# Patient Record
Sex: Male | Born: 1945 | Race: White | Hispanic: No | Marital: Married | Smoking: Never smoker
Health system: Southern US, Community
[De-identification: ages and names within clinical notes are randomized; demographics above are authoritative.]

## PROBLEM LIST (undated history)

## (undated) DIAGNOSIS — K635 Polyp of colon: Secondary | ICD-10-CM

## (undated) DIAGNOSIS — E785 Hyperlipidemia, unspecified: Secondary | ICD-10-CM

## (undated) DIAGNOSIS — K648 Other hemorrhoids: Secondary | ICD-10-CM

## (undated) DIAGNOSIS — I2510 Atherosclerotic heart disease of native coronary artery without angina pectoris: Secondary | ICD-10-CM

## (undated) DIAGNOSIS — K5730 Diverticulosis of large intestine without perforation or abscess without bleeding: Secondary | ICD-10-CM

## (undated) DIAGNOSIS — E119 Type 2 diabetes mellitus without complications: Secondary | ICD-10-CM

## (undated) DIAGNOSIS — Z8546 Personal history of malignant neoplasm of prostate: Secondary | ICD-10-CM

## (undated) DIAGNOSIS — I10 Essential (primary) hypertension: Secondary | ICD-10-CM

## (undated) DIAGNOSIS — K521 Toxic gastroenteritis and colitis: Secondary | ICD-10-CM

## (undated) HISTORY — DX: Atherosclerotic heart disease of native coronary artery without angina pectoris: I25.10

## (undated) HISTORY — DX: Essential (primary) hypertension: I10

## (undated) HISTORY — DX: Diverticulosis of large intestine without perforation or abscess without bleeding: K57.30

## (undated) HISTORY — DX: Toxic gastroenteritis and colitis: K52.1

## (undated) HISTORY — DX: Polyp of colon: K63.5

## (undated) HISTORY — DX: Hyperlipidemia, unspecified: E78.5

## (undated) HISTORY — DX: Type 2 diabetes mellitus without complications: E11.9

## (undated) HISTORY — DX: Personal history of malignant neoplasm of prostate: Z85.46

## (undated) HISTORY — DX: Other hemorrhoids: K64.8

---

## 1994-11-07 HISTORY — PX: OTHER SURGICAL HISTORY: SHX169

## 1998-11-07 HISTORY — PX: OTHER SURGICAL HISTORY: SHX169

## 2001-12-14 DIAGNOSIS — D126 Benign neoplasm of colon, unspecified: Secondary | ICD-10-CM | POA: Insufficient documentation

## 2004-04-27 ENCOUNTER — Encounter (INDEPENDENT_AMBULATORY_CARE_PROVIDER_SITE_OTHER): Payer: Self-pay | Admitting: Gastroenterology

## 2004-11-02 ENCOUNTER — Other Ambulatory Visit: Payer: Self-pay

## 2004-11-02 ENCOUNTER — Emergency Department: Payer: Self-pay | Admitting: Emergency Medicine

## 2004-12-02 ENCOUNTER — Ambulatory Visit: Payer: Self-pay | Admitting: Radiation Oncology

## 2004-12-08 ENCOUNTER — Ambulatory Visit: Payer: Self-pay | Admitting: Radiation Oncology

## 2005-06-02 ENCOUNTER — Ambulatory Visit: Payer: Self-pay | Admitting: Radiation Oncology

## 2005-12-08 ENCOUNTER — Ambulatory Visit: Payer: Self-pay | Admitting: Radiation Oncology

## 2006-03-03 ENCOUNTER — Ambulatory Visit: Payer: Self-pay | Admitting: Gastroenterology

## 2006-04-18 ENCOUNTER — Ambulatory Visit: Payer: Self-pay | Admitting: Gastroenterology

## 2006-04-18 DIAGNOSIS — K648 Other hemorrhoids: Secondary | ICD-10-CM | POA: Insufficient documentation

## 2006-04-18 DIAGNOSIS — K5730 Diverticulosis of large intestine without perforation or abscess without bleeding: Secondary | ICD-10-CM | POA: Insufficient documentation

## 2006-04-18 DIAGNOSIS — K521 Toxic gastroenteritis and colitis: Secondary | ICD-10-CM

## 2006-07-18 ENCOUNTER — Observation Stay: Payer: Self-pay | Admitting: Internal Medicine

## 2006-07-18 ENCOUNTER — Other Ambulatory Visit: Payer: Self-pay

## 2006-12-25 ENCOUNTER — Ambulatory Visit: Payer: Self-pay | Admitting: Internal Medicine

## 2007-06-08 ENCOUNTER — Ambulatory Visit: Payer: Self-pay | Admitting: Radiation Oncology

## 2007-06-22 ENCOUNTER — Ambulatory Visit: Payer: Self-pay | Admitting: Radiation Oncology

## 2007-07-09 ENCOUNTER — Ambulatory Visit: Payer: Self-pay | Admitting: Radiation Oncology

## 2007-12-16 ENCOUNTER — Other Ambulatory Visit: Payer: Self-pay

## 2007-12-16 ENCOUNTER — Emergency Department: Payer: Self-pay | Admitting: Unknown Physician Specialty

## 2008-06-20 ENCOUNTER — Ambulatory Visit: Payer: Self-pay | Admitting: Radiation Oncology

## 2008-07-08 ENCOUNTER — Ambulatory Visit: Payer: Self-pay | Admitting: Radiation Oncology

## 2008-07-29 DIAGNOSIS — Z8546 Personal history of malignant neoplasm of prostate: Secondary | ICD-10-CM

## 2008-07-30 ENCOUNTER — Ambulatory Visit: Payer: Self-pay | Admitting: Gastroenterology

## 2008-07-30 DIAGNOSIS — K520 Gastroenteritis and colitis due to radiation: Secondary | ICD-10-CM | POA: Insufficient documentation

## 2008-07-30 DIAGNOSIS — I2510 Atherosclerotic heart disease of native coronary artery without angina pectoris: Secondary | ICD-10-CM | POA: Insufficient documentation

## 2008-08-18 ENCOUNTER — Ambulatory Visit (HOSPITAL_COMMUNITY): Admission: RE | Admit: 2008-08-18 | Discharge: 2008-08-18 | Payer: Self-pay | Admitting: Gastroenterology

## 2008-08-18 ENCOUNTER — Ambulatory Visit: Payer: Self-pay | Admitting: Gastroenterology

## 2008-11-17 ENCOUNTER — Ambulatory Visit: Payer: Self-pay | Admitting: Gastroenterology

## 2008-12-04 ENCOUNTER — Ambulatory Visit: Payer: Self-pay | Admitting: Urology

## 2008-12-10 ENCOUNTER — Ambulatory Visit: Payer: Self-pay | Admitting: Urology

## 2009-03-07 ENCOUNTER — Ambulatory Visit: Payer: Self-pay | Admitting: Radiation Oncology

## 2009-04-03 ENCOUNTER — Ambulatory Visit: Payer: Self-pay | Admitting: Radiation Oncology

## 2009-04-07 ENCOUNTER — Ambulatory Visit: Payer: Self-pay | Admitting: Radiation Oncology

## 2009-05-07 ENCOUNTER — Ambulatory Visit: Payer: Self-pay | Admitting: Radiation Oncology

## 2009-06-07 ENCOUNTER — Ambulatory Visit: Payer: Self-pay | Admitting: Radiation Oncology

## 2009-07-03 ENCOUNTER — Ambulatory Visit: Payer: Self-pay | Admitting: Nephrology

## 2009-07-08 ENCOUNTER — Ambulatory Visit: Payer: Self-pay | Admitting: Oncology

## 2009-07-16 ENCOUNTER — Encounter: Payer: Self-pay | Admitting: Cardiovascular Disease

## 2009-07-21 ENCOUNTER — Encounter: Payer: Self-pay | Admitting: Cardiovascular Disease

## 2009-07-21 ENCOUNTER — Ambulatory Visit: Payer: Self-pay | Admitting: Urology

## 2009-07-22 ENCOUNTER — Encounter: Payer: Self-pay | Admitting: Cardiovascular Disease

## 2009-07-24 ENCOUNTER — Telehealth (INDEPENDENT_AMBULATORY_CARE_PROVIDER_SITE_OTHER): Payer: Self-pay | Admitting: *Deleted

## 2009-07-24 ENCOUNTER — Ambulatory Visit: Payer: Self-pay | Admitting: Cardiovascular Disease

## 2009-07-27 ENCOUNTER — Encounter: Payer: Self-pay | Admitting: Cardiology

## 2009-07-27 ENCOUNTER — Ambulatory Visit: Payer: Self-pay

## 2009-07-28 ENCOUNTER — Encounter: Payer: Self-pay | Admitting: Cardiovascular Disease

## 2009-07-29 ENCOUNTER — Ambulatory Visit: Payer: Self-pay | Admitting: Urology

## 2009-08-07 ENCOUNTER — Ambulatory Visit: Payer: Self-pay | Admitting: Oncology

## 2009-09-04 ENCOUNTER — Ambulatory Visit: Payer: Self-pay | Admitting: Urology

## 2009-09-07 ENCOUNTER — Ambulatory Visit: Payer: Self-pay | Admitting: Oncology

## 2009-10-07 ENCOUNTER — Ambulatory Visit: Payer: Self-pay | Admitting: Oncology

## 2009-10-16 ENCOUNTER — Ambulatory Visit: Payer: Self-pay | Admitting: Oncology

## 2009-11-07 ENCOUNTER — Ambulatory Visit: Payer: Self-pay | Admitting: Oncology

## 2009-11-30 ENCOUNTER — Ambulatory Visit: Payer: Self-pay | Admitting: Nephrology

## 2009-12-08 ENCOUNTER — Ambulatory Visit: Payer: Self-pay | Admitting: Oncology

## 2010-01-05 ENCOUNTER — Ambulatory Visit: Payer: Self-pay | Admitting: Oncology

## 2010-02-05 ENCOUNTER — Ambulatory Visit: Payer: Self-pay | Admitting: Oncology

## 2010-03-07 ENCOUNTER — Ambulatory Visit: Payer: Self-pay | Admitting: Oncology

## 2010-04-07 ENCOUNTER — Ambulatory Visit: Payer: Self-pay | Admitting: Oncology

## 2010-05-07 ENCOUNTER — Ambulatory Visit: Payer: Self-pay | Admitting: Oncology

## 2010-06-07 ENCOUNTER — Ambulatory Visit: Payer: Self-pay | Admitting: Oncology

## 2010-06-14 ENCOUNTER — Ambulatory Visit: Payer: Self-pay | Admitting: Gastroenterology

## 2010-06-16 LAB — PATHOLOGY REPORT

## 2010-06-18 ENCOUNTER — Ambulatory Visit: Payer: Self-pay | Admitting: Gastroenterology

## 2010-06-30 ENCOUNTER — Ambulatory Visit: Payer: Self-pay | Admitting: Urology

## 2010-07-07 ENCOUNTER — Ambulatory Visit: Payer: Self-pay | Admitting: Oncology

## 2010-07-14 ENCOUNTER — Ambulatory Visit: Payer: Self-pay | Admitting: Urology

## 2010-07-16 ENCOUNTER — Ambulatory Visit: Payer: Self-pay | Admitting: Oncology

## 2010-08-07 ENCOUNTER — Ambulatory Visit: Payer: Self-pay | Admitting: Oncology

## 2010-09-07 ENCOUNTER — Ambulatory Visit: Payer: Self-pay | Admitting: Oncology

## 2010-09-20 ENCOUNTER — Ambulatory Visit: Payer: Self-pay | Admitting: Oncology

## 2010-09-21 LAB — PSA

## 2010-10-02 ENCOUNTER — Inpatient Hospital Stay: Payer: Self-pay | Admitting: Specialist

## 2010-10-07 ENCOUNTER — Ambulatory Visit: Payer: Self-pay | Admitting: Oncology

## 2010-10-26 LAB — PSA

## 2010-11-07 ENCOUNTER — Ambulatory Visit: Payer: Self-pay | Admitting: Oncology

## 2010-11-30 ENCOUNTER — Ambulatory Visit: Payer: Self-pay | Admitting: Surgery

## 2010-12-01 ENCOUNTER — Ambulatory Visit: Payer: Self-pay | Admitting: Internal Medicine

## 2010-12-02 ENCOUNTER — Ambulatory Visit: Payer: Self-pay | Admitting: Surgery

## 2010-12-08 ENCOUNTER — Ambulatory Visit: Payer: Self-pay | Admitting: Oncology

## 2011-01-06 ENCOUNTER — Ambulatory Visit: Payer: Self-pay | Admitting: Oncology

## 2011-02-06 ENCOUNTER — Ambulatory Visit: Payer: Self-pay | Admitting: Oncology

## 2011-03-05 LAB — PSA

## 2011-03-08 ENCOUNTER — Ambulatory Visit: Payer: Self-pay | Admitting: Oncology

## 2011-04-08 ENCOUNTER — Ambulatory Visit: Payer: Self-pay | Admitting: Oncology

## 2011-05-08 ENCOUNTER — Ambulatory Visit: Payer: Self-pay | Admitting: Oncology

## 2011-05-23 ENCOUNTER — Encounter: Payer: Self-pay | Admitting: Cardiovascular Disease

## 2011-05-24 ENCOUNTER — Encounter: Payer: Self-pay | Admitting: Gastroenterology

## 2011-06-08 ENCOUNTER — Ambulatory Visit: Payer: Self-pay | Admitting: Oncology

## 2011-07-05 ENCOUNTER — Ambulatory Visit: Payer: Self-pay | Admitting: Urology

## 2011-07-09 ENCOUNTER — Ambulatory Visit: Payer: Self-pay | Admitting: Oncology

## 2011-07-13 ENCOUNTER — Ambulatory Visit: Payer: Self-pay | Admitting: Urology

## 2011-07-26 LAB — PSA: PSA: 6.1 ng/mL — ABNORMAL HIGH (ref 0.0–4.0)

## 2011-08-08 ENCOUNTER — Ambulatory Visit: Payer: Self-pay | Admitting: Oncology

## 2011-09-08 ENCOUNTER — Ambulatory Visit: Payer: Self-pay | Admitting: Oncology

## 2011-09-30 LAB — PSA: PSA: 5.5 ng/mL — ABNORMAL HIGH (ref 0.0–4.0)

## 2011-10-02 ENCOUNTER — Inpatient Hospital Stay: Payer: Self-pay | Admitting: Internal Medicine

## 2011-10-08 ENCOUNTER — Ambulatory Visit: Payer: Self-pay | Admitting: Oncology

## 2011-11-08 ENCOUNTER — Ambulatory Visit: Payer: Self-pay | Admitting: Oncology

## 2011-11-09 LAB — CANCER CENTER HEMOGLOBIN: HGB: 9.3 g/dL — ABNORMAL LOW (ref 13.0–18.0)

## 2011-11-30 LAB — COMPREHENSIVE METABOLIC PANEL
Albumin: 3.4 g/dL (ref 3.4–5.0)
Alkaline Phosphatase: 62 U/L (ref 50–136)
Bilirubin,Total: 0.3 mg/dL (ref 0.2–1.0)
Calcium, Total: 9 mg/dL (ref 8.5–10.1)
Chloride: 102 mmol/L (ref 98–107)
Co2: 26 mmol/L (ref 21–32)
EGFR (African American): 33 — ABNORMAL LOW
EGFR (Non-African Amer.): 27 — ABNORMAL LOW
Glucose: 185 mg/dL — ABNORMAL HIGH (ref 65–99)
SGPT (ALT): 13 U/L

## 2011-11-30 LAB — CBC CANCER CENTER
Basophil %: 0 %
Eosinophil #: 0.1 x10 3/mm (ref 0.0–0.7)
Eosinophil %: 0.8 %
HCT: 29.9 % — ABNORMAL LOW (ref 40.0–52.0)
MCH: 25 pg — ABNORMAL LOW (ref 26.0–34.0)
MCHC: 31.8 g/dL — ABNORMAL LOW (ref 32.0–36.0)
Monocyte #: 0.5 x10 3/mm (ref 0.0–0.7)
Monocyte %: 4.1 %
Neutrophil #: 10.8 x10 3/mm — ABNORMAL HIGH (ref 1.4–6.5)
Neutrophil %: 91 %
RBC: 3.81 10*6/uL — ABNORMAL LOW (ref 4.40–5.90)
WBC: 11.9 x10 3/mm — ABNORMAL HIGH (ref 3.8–10.6)

## 2011-12-02 LAB — PSA: PSA: 7.6 ng/mL — ABNORMAL HIGH (ref 0.0–4.0)

## 2011-12-09 ENCOUNTER — Ambulatory Visit: Payer: Self-pay | Admitting: Oncology

## 2011-12-21 LAB — CANCER CENTER HEMOGLOBIN: HGB: 8.9 g/dL — ABNORMAL LOW (ref 13.0–18.0)

## 2012-01-06 ENCOUNTER — Ambulatory Visit: Payer: Self-pay | Admitting: Oncology

## 2012-01-11 LAB — COMPREHENSIVE METABOLIC PANEL
Albumin: 3.5 g/dL (ref 3.4–5.0)
Alkaline Phosphatase: 48 U/L — ABNORMAL LOW (ref 50–136)
BUN: 39 mg/dL — ABNORMAL HIGH (ref 7–18)
Calcium, Total: 9 mg/dL (ref 8.5–10.1)
Chloride: 106 mmol/L (ref 98–107)
Creatinine: 2.69 mg/dL — ABNORMAL HIGH (ref 0.60–1.30)
SGOT(AST): 15 U/L (ref 15–37)
SGPT (ALT): 12 U/L
Total Protein: 7.6 g/dL (ref 6.4–8.2)

## 2012-01-11 LAB — CBC CANCER CENTER
Basophil #: 0 x10 3/mm (ref 0.0–0.1)
Eosinophil %: 1.7 %
HCT: 27.5 % — ABNORMAL LOW (ref 40.0–52.0)
HGB: 8.6 g/dL — ABNORMAL LOW (ref 13.0–18.0)
Lymphocyte #: 0.5 x10 3/mm — ABNORMAL LOW (ref 1.0–3.6)
Lymphocyte %: 5.6 %
MCV: 79 fL — ABNORMAL LOW (ref 80–100)
Monocyte #: 0.5 x10 3/mm (ref 0.0–0.7)
Monocyte %: 6.1 %
Platelet: 246 x10 3/mm (ref 150–440)
RBC: 3.48 10*6/uL — ABNORMAL LOW (ref 4.40–5.90)
RDW: 18.3 % — ABNORMAL HIGH (ref 11.5–14.5)
WBC: 8.4 x10 3/mm (ref 3.8–10.6)

## 2012-01-18 LAB — CBC CANCER CENTER
Basophil %: 0.1 %
Eosinophil #: 0.1 x10 3/mm (ref 0.0–0.7)
Lymphocyte %: 4.8 %
MCH: 24.9 pg — ABNORMAL LOW (ref 26.0–34.0)
MCHC: 32.2 g/dL (ref 32.0–36.0)
Monocyte #: 0.4 x10 3/mm (ref 0.0–0.7)
Neutrophil %: 88.6 %
Platelet: 230 x10 3/mm (ref 150–440)
RDW: 18.8 % — ABNORMAL HIGH (ref 11.5–14.5)

## 2012-02-01 LAB — IRON AND TIBC
Iron Bind.Cap.(Total): 300 ug/dL (ref 250–450)
Iron Saturation: 9 %
Iron: 27 ug/dL — ABNORMAL LOW (ref 65–175)
Unbound Iron-Bind.Cap.: 273 ug/dL

## 2012-02-01 LAB — FOLATE: Folic Acid: >100 ng/mL — ABNORMAL HIGH (ref 3.1–100.0)

## 2012-02-01 LAB — CANCER CENTER HEMOGLOBIN: HGB: 9.1 g/dL — ABNORMAL LOW (ref 13.0–18.0)

## 2012-02-06 ENCOUNTER — Ambulatory Visit: Payer: Self-pay | Admitting: Oncology

## 2012-02-15 LAB — BASIC METABOLIC PANEL
BUN: 38 mg/dL — ABNORMAL HIGH (ref 7–18)
Chloride: 105 mmol/L (ref 98–107)
Creatinine: 2.58 mg/dL — ABNORMAL HIGH (ref 0.60–1.30)
EGFR (African American): 32 — ABNORMAL LOW
EGFR (Non-African Amer.): 27 — ABNORMAL LOW
Sodium: 138 mmol/L (ref 136–145)

## 2012-02-15 LAB — CBC WITH DIFFERENTIAL/PLATELET
Basophil #: 0 10*3/uL (ref 0.0–0.1)
Basophil %: 0.4 %
Eosinophil #: 0.1 10*3/uL (ref 0.0–0.7)
HCT: 27.7 % — ABNORMAL LOW (ref 40.0–52.0)
HGB: 8.7 g/dL — ABNORMAL LOW (ref 13.0–18.0)
Lymphocyte %: 4 %
MCH: 24 pg — ABNORMAL LOW (ref 26.0–34.0)
MCHC: 31.5 g/dL — ABNORMAL LOW (ref 32.0–36.0)
Neutrophil #: 7 10*3/uL — ABNORMAL HIGH (ref 1.4–6.5)
Platelet: 246 10*3/uL (ref 150–440)
RBC: 3.63 10*6/uL — ABNORMAL LOW (ref 4.40–5.90)
RDW: 18.3 % — ABNORMAL HIGH (ref 11.5–14.5)
WBC: 7.9 10*3/uL (ref 3.8–10.6)

## 2012-02-16 LAB — PSA: PSA: 18.6 ng/mL — ABNORMAL HIGH (ref 0.0–4.0)

## 2012-03-07 ENCOUNTER — Ambulatory Visit: Payer: Self-pay | Admitting: Oncology

## 2012-03-15 LAB — COMPREHENSIVE METABOLIC PANEL
Alkaline Phosphatase: 67 U/L (ref 50–136)
Bilirubin,Total: 0.2 mg/dL (ref 0.2–1.0)
Calcium, Total: 9.2 mg/dL (ref 8.5–10.1)
Co2: 23 mmol/L (ref 21–32)
Creatinine: 2.61 mg/dL — ABNORMAL HIGH (ref 0.60–1.30)
Glucose: 194 mg/dL — ABNORMAL HIGH (ref 65–99)
Osmolality: 294 (ref 275–301)
SGPT (ALT): 16 U/L
Total Protein: 7.5 g/dL (ref 6.4–8.2)

## 2012-03-15 LAB — CBC CANCER CENTER
Basophil #: 0 x10 3/mm (ref 0.0–0.1)
Basophil %: 0.2 %
Eosinophil %: 0.6 %
HCT: 28.3 % — ABNORMAL LOW (ref 40.0–52.0)
Lymphocyte #: 0.6 x10 3/mm — ABNORMAL LOW (ref 1.0–3.6)
Lymphocyte %: 5.6 %
MCH: 22.7 pg — ABNORMAL LOW (ref 26.0–34.0)
MCHC: 29.8 g/dL — ABNORMAL LOW (ref 32.0–36.0)
MCV: 76 fL — ABNORMAL LOW (ref 80–100)
Monocyte #: 0.5 x10 3/mm (ref 0.2–1.0)
Neutrophil %: 89.2 %
Platelet: 258 x10 3/mm (ref 150–440)
WBC: 10.5 x10 3/mm (ref 3.8–10.6)

## 2012-03-16 LAB — PSA: PSA: 29.7 ng/mL — ABNORMAL HIGH (ref 0.0–4.0)

## 2012-04-05 ENCOUNTER — Ambulatory Visit: Payer: Self-pay | Admitting: Pain Medicine

## 2012-04-07 ENCOUNTER — Ambulatory Visit: Payer: Self-pay | Admitting: Oncology

## 2012-04-18 LAB — CBC CANCER CENTER
Basophil #: 0 x10 3/mm (ref 0.0–0.1)
Basophil %: 0.1 %
Eosinophil #: 0.1 x10 3/mm (ref 0.0–0.7)
Eosinophil %: 1.2 %
HCT: 28.3 % — ABNORMAL LOW (ref 40.0–52.0)
Lymphocyte #: 0.5 x10 3/mm — ABNORMAL LOW (ref 1.0–3.6)
MCV: 77 fL — ABNORMAL LOW (ref 80–100)
Monocyte %: 5.5 %
Neutrophil #: 8 x10 3/mm — ABNORMAL HIGH (ref 1.4–6.5)
Platelet: 239 x10 3/mm (ref 150–440)
WBC: 9.1 x10 3/mm (ref 3.8–10.6)

## 2012-04-18 LAB — COMPREHENSIVE METABOLIC PANEL
Alkaline Phosphatase: 80 U/L (ref 50–136)
Anion Gap: 9 (ref 7–16)
BUN: 44 mg/dL — ABNORMAL HIGH (ref 7–18)
Bilirubin,Total: 0.3 mg/dL (ref 0.2–1.0)
Calcium, Total: 9.4 mg/dL (ref 8.5–10.1)
Chloride: 106 mmol/L (ref 98–107)
SGOT(AST): 15 U/L (ref 15–37)
SGPT (ALT): 15 U/L
Total Protein: 7.5 g/dL (ref 6.4–8.2)

## 2012-04-25 LAB — CANCER CENTER HEMOGLOBIN: HGB: 8.7 g/dL — ABNORMAL LOW (ref 13.0–18.0)

## 2012-05-02 LAB — CANCER CENTER HEMOGLOBIN: HGB: 8.6 g/dL — ABNORMAL LOW (ref 13.0–18.0)

## 2012-05-07 ENCOUNTER — Ambulatory Visit: Payer: Self-pay | Admitting: Oncology

## 2012-05-16 LAB — COMPREHENSIVE METABOLIC PANEL
Albumin: 3.4 g/dL (ref 3.4–5.0)
Alkaline Phosphatase: 86 U/L (ref 50–136)
Anion Gap: 8 (ref 7–16)
BUN: 38 mg/dL — ABNORMAL HIGH (ref 7–18)
Calcium, Total: 9.4 mg/dL (ref 8.5–10.1)
Chloride: 106 mmol/L (ref 98–107)
EGFR (African American): 27 — ABNORMAL LOW
EGFR (Non-African Amer.): 23 — ABNORMAL LOW
Glucose: 213 mg/dL — ABNORMAL HIGH (ref 65–99)
Potassium: 5.9 mmol/L — ABNORMAL HIGH (ref 3.5–5.1)
SGOT(AST): 18 U/L (ref 15–37)
SGPT (ALT): 16 U/L
Total Protein: 7.7 g/dL (ref 6.4–8.2)

## 2012-05-16 LAB — CBC CANCER CENTER
Basophil %: 0.1 %
Eosinophil #: 0 x10 3/mm (ref 0.0–0.7)
Eosinophil %: 0.4 %
HCT: 32.2 % — ABNORMAL LOW (ref 40.0–52.0)
HGB: 9.4 g/dL — ABNORMAL LOW (ref 13.0–18.0)
Lymphocyte #: 0.5 x10 3/mm — ABNORMAL LOW (ref 1.0–3.6)
MCH: 23.2 pg — ABNORMAL LOW (ref 26.0–34.0)
MCHC: 29.3 g/dL — ABNORMAL LOW (ref 32.0–36.0)
Monocyte #: 0.4 x10 3/mm (ref 0.2–1.0)
Monocyte %: 4.8 %
Neutrophil #: 7.9 x10 3/mm — ABNORMAL HIGH (ref 1.4–6.5)
Neutrophil %: 89.1 %
RBC: 4.07 10*6/uL — ABNORMAL LOW (ref 4.40–5.90)

## 2012-05-17 LAB — PSA: PSA: 46.7 ng/mL — ABNORMAL HIGH (ref 0.0–4.0)

## 2012-05-23 LAB — CANCER CENTER HEMOGLOBIN: HGB: 9.7 g/dL — ABNORMAL LOW (ref 13.0–18.0)

## 2012-06-06 LAB — CANCER CENTER HEMOGLOBIN: HGB: 9.9 g/dL — ABNORMAL LOW (ref 13.0–18.0)

## 2012-06-07 ENCOUNTER — Ambulatory Visit: Payer: Self-pay | Admitting: Oncology

## 2012-06-13 LAB — CANCER CENTER HEMOGLOBIN: HGB: 10.1 g/dL — ABNORMAL LOW (ref 13.0–18.0)

## 2012-06-20 LAB — CBC CANCER CENTER
Basophil %: 0.1 %
Eosinophil %: 0.6 %
HCT: 32.2 % — ABNORMAL LOW (ref 40.0–52.0)
HGB: 9.7 g/dL — ABNORMAL LOW (ref 13.0–18.0)
Lymphocyte #: 0.4 x10 3/mm — ABNORMAL LOW (ref 1.0–3.6)
Lymphocyte %: 4.4 %
MCHC: 30.2 g/dL — ABNORMAL LOW (ref 32.0–36.0)
Monocyte #: 0.6 x10 3/mm (ref 0.2–1.0)
Monocyte %: 7 %
Neutrophil #: 7.6 x10 3/mm — ABNORMAL HIGH (ref 1.4–6.5)
Neutrophil %: 87.9 %
Platelet: 207 x10 3/mm (ref 150–440)
RDW: 18.4 % — ABNORMAL HIGH (ref 11.5–14.5)
WBC: 8.6 x10 3/mm (ref 3.8–10.6)

## 2012-06-20 LAB — COMPREHENSIVE METABOLIC PANEL
Albumin: 3.1 g/dL — ABNORMAL LOW (ref 3.4–5.0)
Alkaline Phosphatase: 110 U/L (ref 50–136)
Anion Gap: 7 (ref 7–16)
Bilirubin,Total: 0.4 mg/dL (ref 0.2–1.0)
Co2: 28 mmol/L (ref 21–32)
Creatinine: 2.43 mg/dL — ABNORMAL HIGH (ref 0.60–1.30)
EGFR (African American): 31 — ABNORMAL LOW
EGFR (Non-African Amer.): 26 — ABNORMAL LOW
Glucose: 129 mg/dL — ABNORMAL HIGH (ref 65–99)
Osmolality: 287 (ref 275–301)
SGOT(AST): 15 U/L (ref 15–37)
SGPT (ALT): 18 U/L (ref 12–78)
Sodium: 138 mmol/L (ref 136–145)

## 2012-06-21 LAB — PSA: PSA: 31.8 ng/mL — ABNORMAL HIGH (ref 0.0–4.0)

## 2012-06-27 LAB — CANCER CENTER HEMOGLOBIN: HGB: 9.2 g/dL — ABNORMAL LOW (ref 13.0–18.0)

## 2012-07-04 ENCOUNTER — Ambulatory Visit: Payer: Self-pay | Admitting: Urology

## 2012-07-08 ENCOUNTER — Ambulatory Visit: Payer: Self-pay | Admitting: Oncology

## 2012-07-11 LAB — CANCER CENTER HEMOGLOBIN: HGB: 9.1 g/dL — ABNORMAL LOW (ref 13.0–18.0)

## 2012-07-18 ENCOUNTER — Ambulatory Visit: Payer: Self-pay | Admitting: Urology

## 2012-07-25 LAB — CBC CANCER CENTER
Basophil #: 0 x10 3/mm (ref 0.0–0.1)
Eosinophil #: 0 x10 3/mm (ref 0.0–0.7)
HCT: 32.6 % — ABNORMAL LOW (ref 40.0–52.0)
HGB: 9.6 g/dL — ABNORMAL LOW (ref 13.0–18.0)
Lymphocyte #: 0.7 x10 3/mm — ABNORMAL LOW (ref 1.0–3.6)
Lymphocyte %: 10.4 %
MCH: 22.9 pg — ABNORMAL LOW (ref 26.0–34.0)
MCHC: 29.5 g/dL — ABNORMAL LOW (ref 32.0–36.0)
Monocyte #: 0.4 x10 3/mm (ref 0.2–1.0)
Neutrophil #: 5.9 x10 3/mm (ref 1.4–6.5)
Platelet: 242 x10 3/mm (ref 150–440)
RDW: 18.2 % — ABNORMAL HIGH (ref 11.5–14.5)
WBC: 7.2 x10 3/mm (ref 3.8–10.6)

## 2012-07-25 LAB — COMPREHENSIVE METABOLIC PANEL
Albumin: 3.2 g/dL — ABNORMAL LOW (ref 3.4–5.0)
Anion Gap: 5 — ABNORMAL LOW (ref 7–16)
BUN: 43 mg/dL — ABNORMAL HIGH (ref 7–18)
Bilirubin,Total: 0.3 mg/dL (ref 0.2–1.0)
Chloride: 106 mmol/L (ref 98–107)
Creatinine: 2.84 mg/dL — ABNORMAL HIGH (ref 0.60–1.30)
EGFR (Non-African Amer.): 22 — ABNORMAL LOW
Osmolality: 293 (ref 275–301)
Potassium: 4.9 mmol/L (ref 3.5–5.1)
Sodium: 140 mmol/L (ref 136–145)

## 2012-08-01 LAB — CANCER CENTER HEMOGLOBIN: HGB: 10.1 g/dL — ABNORMAL LOW (ref 13.0–18.0)

## 2012-08-07 ENCOUNTER — Ambulatory Visit: Payer: Self-pay | Admitting: Oncology

## 2012-08-08 LAB — CANCER CENTER HEMOGLOBIN: HGB: 10.3 g/dL — ABNORMAL LOW (ref 13.0–18.0)

## 2012-08-22 LAB — CANCER CENTER HEMOGLOBIN: HGB: 10.4 g/dL — ABNORMAL LOW (ref 13.0–18.0)

## 2012-08-29 LAB — CBC CANCER CENTER
Eosinophil #: 0.1 x10 3/mm (ref 0.0–0.7)
Eosinophil %: 0.7 %
HCT: 33.2 % — ABNORMAL LOW (ref 40.0–52.0)
Lymphocyte %: 9.5 %
MCH: 24.3 pg — ABNORMAL LOW (ref 26.0–34.0)
Monocyte %: 7.3 %
Neutrophil %: 82.2 %
Platelet: 194 x10 3/mm (ref 150–440)
RBC: 4.13 10*6/uL — ABNORMAL LOW (ref 4.40–5.90)
WBC: 7.6 x10 3/mm (ref 3.8–10.6)

## 2012-08-29 LAB — COMPREHENSIVE METABOLIC PANEL
Albumin: 3.2 g/dL — ABNORMAL LOW (ref 3.4–5.0)
Alkaline Phosphatase: 88 U/L (ref 50–136)
Anion Gap: 10 (ref 7–16)
BUN: 39 mg/dL — ABNORMAL HIGH (ref 7–18)
Calcium, Total: 9.3 mg/dL (ref 8.5–10.1)
Co2: 24 mmol/L (ref 21–32)
Creatinine: 2.48 mg/dL — ABNORMAL HIGH (ref 0.60–1.30)
EGFR (African American): 30 — ABNORMAL LOW
EGFR (Non-African Amer.): 26 — ABNORMAL LOW
Glucose: 153 mg/dL — ABNORMAL HIGH (ref 65–99)
SGPT (ALT): 20 U/L (ref 12–78)
Sodium: 139 mmol/L (ref 136–145)
Total Protein: 7.3 g/dL (ref 6.4–8.2)

## 2012-08-30 LAB — PSA: PSA: 54.6 ng/mL — ABNORMAL HIGH (ref 0.0–4.0)

## 2012-09-05 LAB — CANCER CENTER HEMOGLOBIN: HGB: 9.4 g/dL — ABNORMAL LOW (ref 13.0–18.0)

## 2012-09-07 ENCOUNTER — Ambulatory Visit: Payer: Self-pay | Admitting: Oncology

## 2012-09-12 ENCOUNTER — Ambulatory Visit: Payer: Self-pay | Admitting: Emergency Medicine

## 2012-09-12 LAB — BASIC METABOLIC PANEL
Anion Gap: 9 (ref 7–16)
BUN: 35 mg/dL — ABNORMAL HIGH (ref 7–18)
Calcium, Total: 9.1 mg/dL (ref 8.5–10.1)
Creatinine: 2.16 mg/dL — ABNORMAL HIGH (ref 0.60–1.30)
EGFR (African American): 35 — ABNORMAL LOW
EGFR (Non-African Amer.): 31 — ABNORMAL LOW
Glucose: 210 mg/dL — ABNORMAL HIGH (ref 65–99)
Osmolality: 294 (ref 275–301)
Potassium: 4.8 mmol/L (ref 3.5–5.1)
Sodium: 140 mmol/L (ref 136–145)

## 2012-09-12 LAB — CBC WITH DIFFERENTIAL/PLATELET
Basophil #: 0 10*3/uL (ref 0.0–0.1)
Lymphocyte %: 6.1 %
MCH: 24.2 pg — ABNORMAL LOW (ref 26.0–34.0)
Monocyte #: 0.4 x10 3/mm (ref 0.2–1.0)
Monocyte %: 4.7 %
Neutrophil #: 7.3 10*3/uL — ABNORMAL HIGH (ref 1.4–6.5)
Platelet: 244 10*3/uL (ref 150–440)
RDW: 19.9 % — ABNORMAL HIGH (ref 11.5–14.5)
WBC: 8.3 10*3/uL (ref 3.8–10.6)

## 2012-09-12 LAB — CANCER CENTER HEMOGLOBIN: HGB: 10.3 g/dL — ABNORMAL LOW (ref 13.0–18.0)

## 2012-09-20 ENCOUNTER — Inpatient Hospital Stay: Payer: Self-pay | Admitting: Emergency Medicine

## 2012-09-21 LAB — BASIC METABOLIC PANEL
Anion Gap: 4 — ABNORMAL LOW (ref 7–16)
Chloride: 113 mmol/L — ABNORMAL HIGH (ref 98–107)
Co2: 25 mmol/L (ref 21–32)
Creatinine: 1.85 mg/dL — ABNORMAL HIGH (ref 0.60–1.30)
EGFR (African American): 43 — ABNORMAL LOW
EGFR (Non-African Amer.): 37 — ABNORMAL LOW
Potassium: 5 mmol/L (ref 3.5–5.1)
Sodium: 142 mmol/L (ref 136–145)

## 2012-09-21 LAB — CBC WITH DIFFERENTIAL/PLATELET
Basophil #: 0 10*3/uL (ref 0.0–0.1)
Basophil %: 0.2 %
Eosinophil %: 0.4 %
Lymphocyte %: 7 %
MCH: 24.8 pg — ABNORMAL LOW (ref 26.0–34.0)
Monocyte #: 0.5 x10 3/mm (ref 0.2–1.0)
Monocyte %: 7.4 %
Neutrophil #: 6 10*3/uL (ref 1.4–6.5)
RBC: 3.4 10*6/uL — ABNORMAL LOW (ref 4.40–5.90)
RDW: 19 % — ABNORMAL HIGH (ref 11.5–14.5)
WBC: 7.1 10*3/uL (ref 3.8–10.6)

## 2012-09-22 LAB — BASIC METABOLIC PANEL
Anion Gap: 5 — ABNORMAL LOW (ref 7–16)
BUN: 15 mg/dL (ref 7–18)
Chloride: 110 mmol/L — ABNORMAL HIGH (ref 98–107)
Co2: 26 mmol/L (ref 21–32)
Creatinine: 1.78 mg/dL — ABNORMAL HIGH (ref 0.60–1.30)
EGFR (African American): 45 — ABNORMAL LOW
Osmolality: 283 (ref 275–301)
Potassium: 4.7 mmol/L (ref 3.5–5.1)

## 2012-09-23 LAB — CBC WITH DIFFERENTIAL/PLATELET
Basophil #: 0 10*3/uL (ref 0.0–0.1)
Basophil %: 0.2 %
Eosinophil %: 1.5 %
HCT: 24.3 % — ABNORMAL LOW (ref 40.0–52.0)
Lymphocyte %: 7.9 %
MCH: 25.8 pg — ABNORMAL LOW (ref 26.0–34.0)
MCHC: 32.5 g/dL (ref 32.0–36.0)
MCV: 80 fL (ref 80–100)
Monocyte #: 0.5 x10 3/mm (ref 0.2–1.0)
Neutrophil %: 82.6 %
Platelet: 147 10*3/uL — ABNORMAL LOW (ref 150–440)
RBC: 3.05 10*6/uL — ABNORMAL LOW (ref 4.40–5.90)
RDW: 19.3 % — ABNORMAL HIGH (ref 11.5–14.5)

## 2012-10-03 LAB — CBC CANCER CENTER
Basophil %: 0.4 %
Eosinophil #: 0.1 x10 3/mm (ref 0.0–0.7)
Eosinophil %: 0.6 %
HCT: 30.9 % — ABNORMAL LOW (ref 40.0–52.0)
HGB: 9.5 g/dL — ABNORMAL LOW (ref 13.0–18.0)
MCH: 25.2 pg — ABNORMAL LOW (ref 26.0–34.0)
MCHC: 30.8 g/dL — ABNORMAL LOW (ref 32.0–36.0)
MCV: 82 fL (ref 80–100)
Monocyte #: 0.8 x10 3/mm (ref 0.2–1.0)
Neutrophil #: 11.6 x10 3/mm — ABNORMAL HIGH (ref 1.4–6.5)
Platelet: 306 x10 3/mm (ref 150–440)

## 2012-10-03 LAB — COMPREHENSIVE METABOLIC PANEL
Albumin: 3.2 g/dL — ABNORMAL LOW (ref 3.4–5.0)
Anion Gap: 12 (ref 7–16)
BUN: 31 mg/dL — ABNORMAL HIGH (ref 7–18)
Bilirubin,Total: 0.4 mg/dL (ref 0.2–1.0)
Chloride: 103 mmol/L (ref 98–107)
Co2: 25 mmol/L (ref 21–32)
Creatinine: 2.5 mg/dL — ABNORMAL HIGH (ref 0.60–1.30)
EGFR (African American): 30 — ABNORMAL LOW
Glucose: 201 mg/dL — ABNORMAL HIGH (ref 65–99)
Potassium: 5 mmol/L (ref 3.5–5.1)
SGOT(AST): 15 U/L (ref 15–37)
Total Protein: 7.5 g/dL (ref 6.4–8.2)

## 2012-10-05 LAB — PSA: PSA: 59.9 ng/mL — ABNORMAL HIGH (ref 0.0–4.0)

## 2012-10-10 ENCOUNTER — Ambulatory Visit: Payer: Self-pay | Admitting: Oncology

## 2012-10-10 LAB — CANCER CENTER HEMOGLOBIN: HGB: 10.5 g/dL — ABNORMAL LOW (ref 13.0–18.0)

## 2012-10-17 LAB — CANCER CENTER HEMOGLOBIN: HGB: 10.9 g/dL — ABNORMAL LOW (ref 13.0–18.0)

## 2012-10-24 LAB — CANCER CENTER HEMOGLOBIN: HGB: 10.8 g/dL — ABNORMAL LOW (ref 13.0–18.0)

## 2012-11-01 LAB — CANCER CENTER HEMOGLOBIN: HGB: 10.8 g/dL — ABNORMAL LOW (ref 13.0–18.0)

## 2012-11-05 ENCOUNTER — Ambulatory Visit: Payer: Self-pay | Admitting: Urology

## 2012-11-05 LAB — PROTIME-INR
INR: 0.8
Prothrombin Time: 11.7 secs (ref 11.5–14.7)

## 2012-11-05 LAB — APTT: Activated PTT: 24.3 secs (ref 23.6–35.9)

## 2012-11-07 ENCOUNTER — Ambulatory Visit: Payer: Self-pay | Admitting: Oncology

## 2012-11-08 LAB — CBC CANCER CENTER
Basophil #: 0 x10 3/mm (ref 0.0–0.1)
Basophil %: 0.1 %
Eosinophil #: 0.1 x10 3/mm (ref 0.0–0.7)
Eosinophil %: 1.3 %
HGB: 10.7 g/dL — ABNORMAL LOW (ref 13.0–18.0)
Lymphocyte #: 0.7 x10 3/mm — ABNORMAL LOW (ref 1.0–3.6)
MCH: 25.1 pg — ABNORMAL LOW (ref 26.0–34.0)
MCHC: 31.4 g/dL — ABNORMAL LOW (ref 32.0–36.0)
Monocyte #: 0.5 x10 3/mm (ref 0.2–1.0)
Neutrophil #: 7.6 x10 3/mm — ABNORMAL HIGH (ref 1.4–6.5)
Platelet: 259 x10 3/mm (ref 150–440)
RBC: 4.27 10*6/uL — ABNORMAL LOW (ref 4.40–5.90)
RDW: 17 % — ABNORMAL HIGH (ref 11.5–14.5)
WBC: 9 x10 3/mm (ref 3.8–10.6)

## 2012-11-08 LAB — COMPREHENSIVE METABOLIC PANEL
Alkaline Phosphatase: 122 U/L (ref 50–136)
Anion Gap: 8 (ref 7–16)
BUN: 34 mg/dL — ABNORMAL HIGH (ref 7–18)
Bilirubin,Total: 0.3 mg/dL (ref 0.2–1.0)
Chloride: 103 mmol/L (ref 98–107)
Co2: 27 mmol/L (ref 21–32)
Creatinine: 2.6 mg/dL — ABNORMAL HIGH (ref 0.60–1.30)
EGFR (African American): 28 — ABNORMAL LOW
Potassium: 5.8 mmol/L — ABNORMAL HIGH (ref 3.5–5.1)
SGOT(AST): 19 U/L (ref 15–37)
SGPT (ALT): 23 U/L (ref 12–78)
Total Protein: 7.7 g/dL (ref 6.4–8.2)

## 2012-11-09 LAB — PSA: PSA: 71.5 ng/mL — ABNORMAL HIGH (ref 0.0–4.0)

## 2012-11-22 LAB — CANCER CENTER HEMOGLOBIN: HGB: 10.9 g/dL — ABNORMAL LOW (ref 13.0–18.0)

## 2012-12-05 ENCOUNTER — Ambulatory Visit: Payer: Self-pay | Admitting: Urology

## 2012-12-06 LAB — COMPREHENSIVE METABOLIC PANEL
Albumin: 3.4 g/dL (ref 3.4–5.0)
Bilirubin,Total: 0.4 mg/dL (ref 0.2–1.0)
Calcium, Total: 9.4 mg/dL (ref 8.5–10.1)
Chloride: 104 mmol/L (ref 98–107)
EGFR (African American): 31 — ABNORMAL LOW
EGFR (Non-African Amer.): 27 — ABNORMAL LOW
Glucose: 145 mg/dL — ABNORMAL HIGH (ref 65–99)
Osmolality: 294 (ref 275–301)
Potassium: 4.9 mmol/L (ref 3.5–5.1)
SGOT(AST): 25 U/L (ref 15–37)
Sodium: 142 mmol/L (ref 136–145)

## 2012-12-06 LAB — CBC CANCER CENTER
Basophil #: 0 x10 3/mm (ref 0.0–0.1)
Basophil %: 0.3 %
Eosinophil #: 0.1 x10 3/mm (ref 0.0–0.7)
Eosinophil %: 1.6 %
Lymphocyte #: 0.8 x10 3/mm — ABNORMAL LOW (ref 1.0–3.6)
Lymphocyte %: 11.5 %
MCH: 25.9 pg — ABNORMAL LOW (ref 26.0–34.0)
MCHC: 31.5 g/dL — ABNORMAL LOW (ref 32.0–36.0)
MCV: 82 fL (ref 80–100)
Monocyte #: 0.4 x10 3/mm (ref 0.2–1.0)
Monocyte %: 6.2 %
Neutrophil #: 5.5 x10 3/mm (ref 1.4–6.5)
Neutrophil %: 80.4 %
RBC: 4.24 10*6/uL — ABNORMAL LOW (ref 4.40–5.90)
RDW: 19.2 % — ABNORMAL HIGH (ref 11.5–14.5)

## 2012-12-07 LAB — PSA: PSA: 69.6 ng/mL — ABNORMAL HIGH (ref 0.0–4.0)

## 2012-12-08 ENCOUNTER — Ambulatory Visit: Payer: Self-pay | Admitting: Oncology

## 2012-12-13 ENCOUNTER — Ambulatory Visit: Payer: Self-pay | Admitting: Oncology

## 2012-12-13 LAB — CBC CANCER CENTER
Basophil #: 0 x10 3/mm (ref 0.0–0.1)
Eosinophil #: 0.1 x10 3/mm (ref 0.0–0.7)
HGB: 9.9 g/dL — ABNORMAL LOW (ref 13.0–18.0)
Lymphocyte #: 0.5 x10 3/mm — ABNORMAL LOW (ref 1.0–3.6)
MCH: 25.7 pg — ABNORMAL LOW (ref 26.0–34.0)
MCV: 82 fL (ref 80–100)
Monocyte #: 0.3 x10 3/mm (ref 0.2–1.0)
Neutrophil %: 83.2 %
Platelet: 156 x10 3/mm (ref 150–440)
RDW: 19 % — ABNORMAL HIGH (ref 11.5–14.5)
WBC: 6 x10 3/mm (ref 3.8–10.6)

## 2012-12-19 ENCOUNTER — Ambulatory Visit: Payer: Self-pay | Admitting: Urology

## 2012-12-27 LAB — CBC CANCER CENTER
Basophil %: 0.5 %
Eosinophil #: 0.1 x10 3/mm (ref 0.0–0.7)
Eosinophil %: 0.5 %
HCT: 32.3 % — ABNORMAL LOW (ref 40.0–52.0)
HGB: 10.3 g/dL — ABNORMAL LOW (ref 13.0–18.0)
Lymphocyte %: 7.8 %
MCH: 26.5 pg (ref 26.0–34.0)
Neutrophil #: 8.3 x10 3/mm — ABNORMAL HIGH (ref 1.4–6.5)
Neutrophil %: 83.6 %
Platelet: 275 x10 3/mm (ref 150–440)
RBC: 3.88 10*6/uL — ABNORMAL LOW (ref 4.40–5.90)
RDW: 19 % — ABNORMAL HIGH (ref 11.5–14.5)

## 2012-12-27 LAB — COMPREHENSIVE METABOLIC PANEL
Alkaline Phosphatase: 81 U/L (ref 50–136)
Anion Gap: 7 (ref 7–16)
BUN: 33 mg/dL — ABNORMAL HIGH (ref 7–18)
Bilirubin,Total: 0.3 mg/dL (ref 0.2–1.0)
Chloride: 105 mmol/L (ref 98–107)
Co2: 29 mmol/L (ref 21–32)
Creatinine: 2.25 mg/dL — ABNORMAL HIGH (ref 0.60–1.30)
EGFR (Non-African Amer.): 29 — ABNORMAL LOW
Glucose: 121 mg/dL — ABNORMAL HIGH (ref 65–99)
Osmolality: 290 (ref 275–301)
SGPT (ALT): 17 U/L (ref 12–78)
Sodium: 141 mmol/L (ref 136–145)
Total Protein: 7.5 g/dL (ref 6.4–8.2)

## 2012-12-28 LAB — PSA: PSA: 81.5 ng/mL — ABNORMAL HIGH (ref 0.0–4.0)

## 2013-01-03 LAB — CBC CANCER CENTER
Basophil #: 0.1 x10 3/mm (ref 0.0–0.1)
Eosinophil #: 0.1 x10 3/mm (ref 0.0–0.7)
HGB: 10.1 g/dL — ABNORMAL LOW (ref 13.0–18.0)
Lymphocyte %: 7.8 %
MCH: 26.3 pg (ref 26.0–34.0)
MCHC: 31.6 g/dL — ABNORMAL LOW (ref 32.0–36.0)
MCV: 83 fL (ref 80–100)
Monocyte #: 0.4 x10 3/mm (ref 0.2–1.0)
Monocyte %: 6.1 %
Neutrophil #: 6.2 x10 3/mm (ref 1.4–6.5)
Neutrophil %: 83.9 %
Platelet: 198 x10 3/mm (ref 150–440)
RBC: 3.85 10*6/uL — ABNORMAL LOW (ref 4.40–5.90)
RDW: 19.1 % — ABNORMAL HIGH (ref 11.5–14.5)
WBC: 7.3 x10 3/mm (ref 3.8–10.6)

## 2013-01-05 ENCOUNTER — Ambulatory Visit: Payer: Self-pay | Admitting: Oncology

## 2013-01-10 LAB — CBC CANCER CENTER
Basophil #: 0 x10 3/mm (ref 0.0–0.1)
Basophil %: 0.3 %
Eosinophil %: 0.5 %
HCT: 34.5 % — ABNORMAL LOW (ref 40.0–52.0)
Lymphocyte #: 0.8 x10 3/mm — ABNORMAL LOW (ref 1.0–3.6)
Lymphocyte %: 4.7 %
MCH: 26.1 pg (ref 26.0–34.0)
MCHC: 31.1 g/dL — ABNORMAL LOW (ref 32.0–36.0)
MCV: 84 fL (ref 80–100)
Monocyte %: 5.7 %
Neutrophil #: 15.1 x10 3/mm — ABNORMAL HIGH (ref 1.4–6.5)
RBC: 4.11 10*6/uL — ABNORMAL LOW (ref 4.40–5.90)
RDW: 19.5 % — ABNORMAL HIGH (ref 11.5–14.5)
WBC: 17 x10 3/mm — ABNORMAL HIGH (ref 3.8–10.6)

## 2013-01-17 LAB — COMPREHENSIVE METABOLIC PANEL
Albumin: 3.4 g/dL (ref 3.4–5.0)
Alkaline Phosphatase: 89 U/L (ref 50–136)
Anion Gap: 8 (ref 7–16)
Calcium, Total: 9.1 mg/dL (ref 8.5–10.1)
Co2: 27 mmol/L (ref 21–32)
EGFR (Non-African Amer.): 25 — ABNORMAL LOW
Glucose: 272 mg/dL — ABNORMAL HIGH (ref 65–99)
Osmolality: 293 (ref 275–301)
Potassium: 5.9 mmol/L — ABNORMAL HIGH (ref 3.5–5.1)
SGOT(AST): 15 U/L (ref 15–37)
Sodium: 138 mmol/L (ref 136–145)

## 2013-01-17 LAB — CBC CANCER CENTER
Basophil %: 0.3 %
Eosinophil #: 0 x10 3/mm (ref 0.0–0.7)
Eosinophil %: 0 %
HCT: 33 % — ABNORMAL LOW (ref 40.0–52.0)
HGB: 10.6 g/dL — ABNORMAL LOW (ref 13.0–18.0)
Lymphocyte #: 0.6 x10 3/mm — ABNORMAL LOW (ref 1.0–3.6)
Lymphocyte %: 4.8 %
MCH: 26.8 pg (ref 26.0–34.0)
MCHC: 32 g/dL (ref 32.0–36.0)
MCV: 84 fL (ref 80–100)
Monocyte #: 0.5 x10 3/mm (ref 0.2–1.0)
Monocyte %: 4.2 %
Platelet: 174 x10 3/mm (ref 150–440)
WBC: 12.1 x10 3/mm — ABNORMAL HIGH (ref 3.8–10.6)

## 2013-01-18 LAB — PSA: PSA: 76.6 ng/mL — ABNORMAL HIGH (ref 0.0–4.0)

## 2013-01-31 LAB — CANCER CENTER HEMOGLOBIN: HGB: 10.7 g/dL — ABNORMAL LOW (ref 13.0–18.0)

## 2013-02-05 ENCOUNTER — Ambulatory Visit: Payer: Self-pay | Admitting: Oncology

## 2013-02-07 LAB — COMPREHENSIVE METABOLIC PANEL
Albumin: 3.5 g/dL (ref 3.4–5.0)
Alkaline Phosphatase: 89 U/L (ref 50–136)
BUN: 38 mg/dL — ABNORMAL HIGH (ref 7–18)
Bilirubin,Total: 0.4 mg/dL (ref 0.2–1.0)
Calcium, Total: 9.1 mg/dL (ref 8.5–10.1)
Chloride: 104 mmol/L (ref 98–107)
Co2: 29 mmol/L (ref 21–32)
Creatinine: 2.67 mg/dL — ABNORMAL HIGH (ref 0.60–1.30)
EGFR (African American): 27 — ABNORMAL LOW
EGFR (Non-African Amer.): 24 — ABNORMAL LOW
Glucose: 152 mg/dL — ABNORMAL HIGH (ref 65–99)
Osmolality: 291 (ref 275–301)
Potassium: 5.6 mmol/L — ABNORMAL HIGH (ref 3.5–5.1)
SGPT (ALT): 17 U/L (ref 12–78)
Total Protein: 7.5 g/dL (ref 6.4–8.2)

## 2013-02-07 LAB — CBC CANCER CENTER
Basophil %: 0.4 %
Eosinophil #: 0.1 x10 3/mm (ref 0.0–0.7)
HCT: 34.8 % — ABNORMAL LOW (ref 40.0–52.0)
HGB: 10.8 g/dL — ABNORMAL LOW (ref 13.0–18.0)
Lymphocyte #: 0.7 x10 3/mm — ABNORMAL LOW (ref 1.0–3.6)
Lymphocyte %: 6.6 %
MCH: 26.6 pg (ref 26.0–34.0)
MCV: 85 fL (ref 80–100)
Monocyte #: 0.9 x10 3/mm (ref 0.2–1.0)
Monocyte %: 8.8 %
RBC: 4.08 10*6/uL — ABNORMAL LOW (ref 4.40–5.90)
RDW: 19.3 % — ABNORMAL HIGH (ref 11.5–14.5)
WBC: 10.2 x10 3/mm (ref 3.8–10.6)

## 2013-02-16 LAB — PSA: PSA: 59.4 ng/mL — ABNORMAL HIGH (ref 0.0–4.0)

## 2013-02-21 LAB — CBC CANCER CENTER
Basophil #: 0 x10 3/mm (ref 0.0–0.1)
Eosinophil #: 0.1 x10 3/mm (ref 0.0–0.7)
Eosinophil %: 0.8 %
HGB: 10.6 g/dL — ABNORMAL LOW (ref 13.0–18.0)
Lymphocyte #: 0.7 x10 3/mm — ABNORMAL LOW (ref 1.0–3.6)
Lymphocyte %: 7.2 %
MCHC: 31.8 g/dL — ABNORMAL LOW (ref 32.0–36.0)
MCV: 85 fL (ref 80–100)
Neutrophil #: 9.1 x10 3/mm — ABNORMAL HIGH (ref 1.4–6.5)
Neutrophil %: 87.2 %
Platelet: 217 x10 3/mm (ref 150–440)
RDW: 18.3 % — ABNORMAL HIGH (ref 11.5–14.5)
WBC: 10.4 x10 3/mm (ref 3.8–10.6)

## 2013-02-28 LAB — COMPREHENSIVE METABOLIC PANEL
Albumin: 3.4 g/dL (ref 3.4–5.0)
Alkaline Phosphatase: 75 U/L (ref 50–136)
Anion Gap: 11 (ref 7–16)
Bilirubin,Total: 0.3 mg/dL (ref 0.2–1.0)
Calcium, Total: 9.3 mg/dL (ref 8.5–10.1)
Creatinine: 2.63 mg/dL — ABNORMAL HIGH (ref 0.60–1.30)
EGFR (African American): 28 — ABNORMAL LOW
Glucose: 157 mg/dL — ABNORMAL HIGH (ref 65–99)
Osmolality: 292 (ref 275–301)
SGOT(AST): 15 U/L (ref 15–37)
SGPT (ALT): 16 U/L (ref 12–78)

## 2013-02-28 LAB — CBC CANCER CENTER
Basophil #: 0 x10 3/mm (ref 0.0–0.1)
Basophil %: 0.3 %
Eosinophil %: 0.7 %
HCT: 32.3 % — ABNORMAL LOW (ref 40.0–52.0)
HGB: 10.4 g/dL — ABNORMAL LOW (ref 13.0–18.0)
Lymphocyte #: 0.7 x10 3/mm — ABNORMAL LOW (ref 1.0–3.6)
Lymphocyte %: 7.3 %
MCH: 27.1 pg (ref 26.0–34.0)
MCHC: 32.1 g/dL (ref 32.0–36.0)
MCV: 85 fL (ref 80–100)
Monocyte %: 6.6 %
Neutrophil #: 8.4 x10 3/mm — ABNORMAL HIGH (ref 1.4–6.5)
Neutrophil %: 85.1 %
RBC: 3.82 10*6/uL — ABNORMAL LOW (ref 4.40–5.90)
RDW: 17.9 % — ABNORMAL HIGH (ref 11.5–14.5)

## 2013-03-07 ENCOUNTER — Ambulatory Visit: Payer: Self-pay | Admitting: Oncology

## 2013-03-07 LAB — CANCER CENTER HEMOGLOBIN: HGB: 10.2 g/dL — ABNORMAL LOW (ref 13.0–18.0)

## 2013-03-28 LAB — CBC CANCER CENTER
Basophil #: 0 x10 3/mm (ref 0.0–0.1)
Basophil %: 0.2 %
Eosinophil #: 0.2 x10 3/mm (ref 0.0–0.7)
HGB: 10.2 g/dL — ABNORMAL LOW (ref 13.0–18.0)
Lymphocyte #: 0.6 x10 3/mm — ABNORMAL LOW (ref 1.0–3.6)
Lymphocyte %: 6.7 %
MCHC: 31.9 g/dL — ABNORMAL LOW (ref 32.0–36.0)
MCV: 85 fL (ref 80–100)
Monocyte #: 0.6 x10 3/mm (ref 0.2–1.0)
Monocyte %: 6.6 %
Platelet: 218 x10 3/mm (ref 150–440)
RBC: 3.74 10*6/uL — ABNORMAL LOW (ref 4.40–5.90)
RDW: 17.1 % — ABNORMAL HIGH (ref 11.5–14.5)
WBC: 9.5 x10 3/mm (ref 3.8–10.6)

## 2013-03-28 LAB — COMPREHENSIVE METABOLIC PANEL
Albumin: 3.4 g/dL (ref 3.4–5.0)
Alkaline Phosphatase: 82 U/L (ref 50–136)
BUN: 37 mg/dL — ABNORMAL HIGH (ref 7–18)
Bilirubin,Total: 0.3 mg/dL (ref 0.2–1.0)
Calcium, Total: 9.3 mg/dL (ref 8.5–10.1)
Chloride: 104 mmol/L (ref 98–107)
Co2: 26 mmol/L (ref 21–32)
Creatinine: 2.29 mg/dL — ABNORMAL HIGH (ref 0.60–1.30)
EGFR (Non-African Amer.): 28 — ABNORMAL LOW
Glucose: 199 mg/dL — ABNORMAL HIGH (ref 65–99)
Potassium: 5.5 mmol/L — ABNORMAL HIGH (ref 3.5–5.1)
SGOT(AST): 14 U/L — ABNORMAL LOW (ref 15–37)
Total Protein: 7.4 g/dL (ref 6.4–8.2)

## 2013-03-30 LAB — PSA: PSA: 71.2 ng/mL — ABNORMAL HIGH (ref 0.0–4.0)

## 2013-04-04 LAB — CBC CANCER CENTER
Basophil %: 0.4 %
Eosinophil #: 0.2 x10 3/mm (ref 0.0–0.7)
Eosinophil %: 1.6 %
HCT: 31.5 % — ABNORMAL LOW (ref 40.0–52.0)
HGB: 9.9 g/dL — ABNORMAL LOW (ref 13.0–18.0)
Lymphocyte #: 0.7 x10 3/mm — ABNORMAL LOW (ref 1.0–3.6)
Lymphocyte %: 4.7 %
MCH: 26.9 pg (ref 26.0–34.0)
MCHC: 31.4 g/dL — ABNORMAL LOW (ref 32.0–36.0)
Monocyte #: 0.7 x10 3/mm (ref 0.2–1.0)
Monocyte %: 4.7 %
Neutrophil #: 12.5 x10 3/mm — ABNORMAL HIGH (ref 1.4–6.5)
Neutrophil %: 88.6 %
Platelet: 191 x10 3/mm (ref 150–440)
RBC: 3.68 10*6/uL — ABNORMAL LOW (ref 4.40–5.90)
RDW: 17.2 % — ABNORMAL HIGH (ref 11.5–14.5)
WBC: 14.1 x10 3/mm — ABNORMAL HIGH (ref 3.8–10.6)

## 2013-04-07 ENCOUNTER — Ambulatory Visit: Payer: Self-pay | Admitting: Oncology

## 2013-04-11 LAB — CBC CANCER CENTER
Basophil #: 0 x10 3/mm (ref 0.0–0.1)
Eosinophil #: 0.1 x10 3/mm (ref 0.0–0.7)
Eosinophil %: 1 %
HGB: 10.7 g/dL — ABNORMAL LOW (ref 13.0–18.0)
Lymphocyte %: 5 %
MCH: 27.7 pg (ref 26.0–34.0)
MCV: 86 fL (ref 80–100)
Monocyte #: 0.6 x10 3/mm (ref 0.2–1.0)
Monocyte %: 5 %
Neutrophil #: 9.7 x10 3/mm — ABNORMAL HIGH (ref 1.4–6.5)
Platelet: 197 x10 3/mm (ref 150–440)
RBC: 3.86 10*6/uL — ABNORMAL LOW (ref 4.40–5.90)
RDW: 18.5 % — ABNORMAL HIGH (ref 11.5–14.5)
WBC: 10.9 x10 3/mm — ABNORMAL HIGH (ref 3.8–10.6)

## 2013-04-18 LAB — COMPREHENSIVE METABOLIC PANEL
Alkaline Phosphatase: 94 U/L (ref 50–136)
Bilirubin,Total: 0.4 mg/dL (ref 0.2–1.0)
Calcium, Total: 9.2 mg/dL (ref 8.5–10.1)
Co2: 28 mmol/L (ref 21–32)
Creatinine: 2.44 mg/dL — ABNORMAL HIGH (ref 0.60–1.30)
EGFR (African American): 31 — ABNORMAL LOW
EGFR (Non-African Amer.): 26 — ABNORMAL LOW
Osmolality: 286 (ref 275–301)
SGOT(AST): 22 U/L (ref 15–37)
SGPT (ALT): 18 U/L (ref 12–78)
Sodium: 138 mmol/L (ref 136–145)
Total Protein: 7.5 g/dL (ref 6.4–8.2)

## 2013-04-18 LAB — CBC CANCER CENTER
Basophil %: 0.4 %
Eosinophil %: 0.4 %
HGB: 11 g/dL — ABNORMAL LOW (ref 13.0–18.0)
Lymphocyte #: 0.5 x10 3/mm — ABNORMAL LOW (ref 1.0–3.6)
Lymphocyte %: 4.8 %
Monocyte %: 6.4 %
Neutrophil #: 9.6 x10 3/mm — ABNORMAL HIGH (ref 1.4–6.5)
Platelet: 219 x10 3/mm (ref 150–440)
RBC: 3.95 10*6/uL — ABNORMAL LOW (ref 4.40–5.90)

## 2013-04-19 LAB — PSA: PSA: 88.3 ng/mL — ABNORMAL HIGH (ref 0.0–4.0)

## 2013-04-26 LAB — CANCER CENTER HEMOGLOBIN: HGB: 11 g/dL — ABNORMAL LOW (ref 13.0–18.0)

## 2013-05-07 ENCOUNTER — Ambulatory Visit: Payer: Self-pay | Admitting: Oncology

## 2013-05-16 LAB — CBC CANCER CENTER
Basophil #: 0 x10 3/mm (ref 0.0–0.1)
Eosinophil #: 0.2 x10 3/mm (ref 0.0–0.7)
Eosinophil %: 1.8 %
Lymphocyte #: 0.6 x10 3/mm — ABNORMAL LOW (ref 1.0–3.6)
MCH: 26.8 pg (ref 26.0–34.0)
MCHC: 32.5 g/dL (ref 32.0–36.0)
Monocyte %: 5.9 %
Neutrophil %: 86.5 %

## 2013-05-16 LAB — COMPREHENSIVE METABOLIC PANEL
Albumin: 3.5 g/dL (ref 3.4–5.0)
Anion Gap: 4 — ABNORMAL LOW (ref 7–16)
BUN: 35 mg/dL — ABNORMAL HIGH (ref 7–18)
Bilirubin,Total: 0.3 mg/dL (ref 0.2–1.0)
Calcium, Total: 9.5 mg/dL (ref 8.5–10.1)
Chloride: 104 mmol/L (ref 98–107)
Co2: 30 mmol/L (ref 21–32)
Creatinine: 2.11 mg/dL — ABNORMAL HIGH (ref 0.60–1.30)
EGFR (African American): 36 — ABNORMAL LOW
EGFR (Non-African Amer.): 31 — ABNORMAL LOW
Osmolality: 285 (ref 275–301)
Potassium: 5.6 mmol/L — ABNORMAL HIGH (ref 3.5–5.1)
SGOT(AST): 20 U/L (ref 15–37)
SGPT (ALT): 16 U/L (ref 12–78)
Sodium: 138 mmol/L (ref 136–145)

## 2013-05-23 LAB — CBC CANCER CENTER
Basophil #: 0 x10 3/mm (ref 0.0–0.1)
Basophil %: 0.2 %
Eosinophil #: 0.2 x10 3/mm (ref 0.0–0.7)
Eosinophil %: 1.2 %
HCT: 34.5 % — ABNORMAL LOW (ref 40.0–52.0)
HGB: 10.8 g/dL — ABNORMAL LOW (ref 13.0–18.0)
MCH: 26.1 pg (ref 26.0–34.0)
MCHC: 31.4 g/dL — ABNORMAL LOW (ref 32.0–36.0)
Monocyte %: 6 %
RBC: 4.15 10*6/uL — ABNORMAL LOW (ref 4.40–5.90)
RDW: 17.4 % — ABNORMAL HIGH (ref 11.5–14.5)
WBC: 13 x10 3/mm — ABNORMAL HIGH (ref 3.8–10.6)

## 2013-05-23 LAB — COMPREHENSIVE METABOLIC PANEL
Anion Gap: 3 — ABNORMAL LOW (ref 7–16)
BUN: 38 mg/dL — ABNORMAL HIGH (ref 7–18)
Calcium, Total: 9.1 mg/dL (ref 8.5–10.1)
Chloride: 105 mmol/L (ref 98–107)
Co2: 29 mmol/L (ref 21–32)
EGFR (African American): 30 — ABNORMAL LOW
EGFR (Non-African Amer.): 26 — ABNORMAL LOW
Glucose: 188 mg/dL — ABNORMAL HIGH (ref 65–99)
Potassium: 4.8 mmol/L (ref 3.5–5.1)
SGOT(AST): 27 U/L (ref 15–37)
SGPT (ALT): 16 U/L (ref 12–78)
Sodium: 137 mmol/L (ref 136–145)
Total Protein: 7.6 g/dL (ref 6.4–8.2)

## 2013-05-30 LAB — CANCER CENTER HEMOGLOBIN: HGB: 10.9 g/dL — ABNORMAL LOW (ref 13.0–18.0)

## 2013-06-06 LAB — CANCER CENTER HEMOGLOBIN: HGB: 11.4 g/dL — ABNORMAL LOW (ref 13.0–18.0)

## 2013-06-07 ENCOUNTER — Ambulatory Visit: Payer: Self-pay | Admitting: Oncology

## 2013-06-13 LAB — COMPREHENSIVE METABOLIC PANEL
Alkaline Phosphatase: 121 U/L (ref 50–136)
Anion Gap: 6 — ABNORMAL LOW (ref 7–16)
BUN: 35 mg/dL — ABNORMAL HIGH (ref 7–18)
Calcium, Total: 9.3 mg/dL (ref 8.5–10.1)
Chloride: 102 mmol/L (ref 98–107)
Co2: 30 mmol/L (ref 21–32)
Creatinine: 2.46 mg/dL — ABNORMAL HIGH (ref 0.60–1.30)
EGFR (African American): 30 — ABNORMAL LOW
EGFR (Non-African Amer.): 26 — ABNORMAL LOW
Potassium: 5.6 mmol/L — ABNORMAL HIGH (ref 3.5–5.1)
SGOT(AST): 17 U/L (ref 15–37)
Sodium: 138 mmol/L (ref 136–145)
Total Protein: 7.4 g/dL (ref 6.4–8.2)

## 2013-06-13 LAB — CBC CANCER CENTER
Basophil %: 0.2 %
Eosinophil #: 0 x10 3/mm (ref 0.0–0.7)
HCT: 33.5 % — ABNORMAL LOW (ref 40.0–52.0)
HGB: 11 g/dL — ABNORMAL LOW (ref 13.0–18.0)
Lymphocyte #: 0.6 x10 3/mm — ABNORMAL LOW (ref 1.0–3.6)
MCH: 26.9 pg (ref 26.0–34.0)
MCV: 82 fL (ref 80–100)
Monocyte #: 1 x10 3/mm (ref 0.2–1.0)
Monocyte %: 9.4 %
Neutrophil %: 84.4 %
RDW: 17.8 % — ABNORMAL HIGH (ref 11.5–14.5)
WBC: 10.4 x10 3/mm (ref 3.8–10.6)

## 2013-06-16 LAB — PSA: PSA: 130 ng/mL — ABNORMAL HIGH (ref 0.0–4.0)

## 2013-06-20 LAB — CBC CANCER CENTER
Basophil #: 0.1 x10 3/mm (ref 0.0–0.1)
Eosinophil #: 0.1 x10 3/mm (ref 0.0–0.7)
HCT: 33.7 % — ABNORMAL LOW (ref 40.0–52.0)
HGB: 10.8 g/dL — ABNORMAL LOW (ref 13.0–18.0)
Lymphocyte %: 7.7 %
MCH: 26.4 pg (ref 26.0–34.0)
MCHC: 32.1 g/dL (ref 32.0–36.0)
MCV: 82 fL (ref 80–100)
Monocyte #: 1.1 x10 3/mm — ABNORMAL HIGH (ref 0.2–1.0)
Monocyte %: 7.9 %
Neutrophil #: 11.2 x10 3/mm — ABNORMAL HIGH (ref 1.4–6.5)
Neutrophil %: 83.3 %
Platelet: 217 x10 3/mm (ref 150–440)
RDW: 18 % — ABNORMAL HIGH (ref 11.5–14.5)

## 2013-06-27 LAB — CBC CANCER CENTER
Basophil #: 0 x10 3/mm (ref 0.0–0.1)
HCT: 33.8 % — ABNORMAL LOW (ref 40.0–52.0)
HGB: 10.9 g/dL — ABNORMAL LOW (ref 13.0–18.0)
MCHC: 32.2 g/dL (ref 32.0–36.0)
MCV: 82 fL (ref 80–100)
Monocyte %: 4.2 %
Neutrophil #: 11.2 x10 3/mm — ABNORMAL HIGH (ref 1.4–6.5)
Neutrophil %: 90.4 %
Platelet: 202 x10 3/mm (ref 150–440)
RBC: 4.13 10*6/uL — ABNORMAL LOW (ref 4.40–5.90)
RDW: 18 % — ABNORMAL HIGH (ref 11.5–14.5)

## 2013-06-28 LAB — PSA: PSA: 108.7 ng/mL — ABNORMAL HIGH (ref 0.0–4.0)

## 2013-07-04 LAB — COMPREHENSIVE METABOLIC PANEL
Albumin: 3.3 g/dL — ABNORMAL LOW (ref 3.4–5.0)
Anion Gap: 10 (ref 7–16)
BUN: 32 mg/dL — ABNORMAL HIGH (ref 7–18)
Bilirubin,Total: 0.4 mg/dL (ref 0.2–1.0)
Calcium, Total: 9.1 mg/dL (ref 8.5–10.1)
Chloride: 104 mmol/L (ref 98–107)
Creatinine: 2.29 mg/dL — ABNORMAL HIGH (ref 0.60–1.30)
EGFR (Non-African Amer.): 28 — ABNORMAL LOW
Glucose: 167 mg/dL — ABNORMAL HIGH (ref 65–99)
Potassium: 4.6 mmol/L (ref 3.5–5.1)
SGOT(AST): 15 U/L (ref 15–37)
SGPT (ALT): 14 U/L (ref 12–78)
Sodium: 140 mmol/L (ref 136–145)
Total Protein: 7.1 g/dL (ref 6.4–8.2)

## 2013-07-04 LAB — CBC CANCER CENTER
Basophil #: 0 x10 3/mm (ref 0.0–0.1)
Basophil %: 0.4 %
Eosinophil #: 0 x10 3/mm (ref 0.0–0.7)
HCT: 33 % — ABNORMAL LOW (ref 40.0–52.0)
Lymphocyte #: 0.6 x10 3/mm — ABNORMAL LOW (ref 1.0–3.6)
Lymphocyte %: 6.2 %
MCHC: 32.5 g/dL (ref 32.0–36.0)
MCV: 82 fL (ref 80–100)
Monocyte %: 7.2 %
Neutrophil #: 8.3 x10 3/mm — ABNORMAL HIGH (ref 1.4–6.5)
Neutrophil %: 86 %
RDW: 18.7 % — ABNORMAL HIGH (ref 11.5–14.5)

## 2013-07-08 ENCOUNTER — Ambulatory Visit: Payer: Self-pay | Admitting: Oncology

## 2013-07-19 LAB — PSA: PSA: 97.5 ng/mL — ABNORMAL HIGH (ref 0.0–4.0)

## 2013-07-25 LAB — COMPREHENSIVE METABOLIC PANEL
BUN: 28 mg/dL — ABNORMAL HIGH (ref 7–18)
Bilirubin,Total: 0.3 mg/dL (ref 0.2–1.0)
Calcium, Total: 9.5 mg/dL (ref 8.5–10.1)
Chloride: 104 mmol/L (ref 98–107)
Co2: 26 mmol/L (ref 21–32)
Creatinine: 2.17 mg/dL — ABNORMAL HIGH (ref 0.60–1.30)
EGFR (African American): 35 — ABNORMAL LOW
EGFR (Non-African Amer.): 30 — ABNORMAL LOW
Sodium: 139 mmol/L (ref 136–145)
Total Protein: 7.2 g/dL (ref 6.4–8.2)

## 2013-07-25 LAB — CBC CANCER CENTER
Basophil #: 0 x10 3/mm (ref 0.0–0.1)
Eosinophil %: 0.2 %
Lymphocyte #: 0.7 x10 3/mm — ABNORMAL LOW (ref 1.0–3.6)
Lymphocyte %: 6.7 %
MCHC: 31.8 g/dL — ABNORMAL LOW (ref 32.0–36.0)
MCV: 83 fL (ref 80–100)
Monocyte %: 8.1 %
Neutrophil #: 8.4 x10 3/mm — ABNORMAL HIGH (ref 1.4–6.5)
Neutrophil %: 84.7 %
Platelet: 214 x10 3/mm (ref 150–440)
RBC: 4.08 10*6/uL — ABNORMAL LOW (ref 4.40–5.90)
WBC: 9.9 x10 3/mm (ref 3.8–10.6)

## 2013-08-01 LAB — CBC CANCER CENTER
Basophil #: 0 x10 3/mm (ref 0.0–0.1)
Eosinophil #: 0.1 x10 3/mm (ref 0.0–0.7)
HCT: 30.6 % — ABNORMAL LOW (ref 40.0–52.0)
HGB: 9.9 g/dL — ABNORMAL LOW (ref 13.0–18.0)
Lymphocyte #: 0.7 x10 3/mm — ABNORMAL LOW (ref 1.0–3.6)
Lymphocyte %: 5.6 %
MCH: 26.8 pg (ref 26.0–34.0)
MCHC: 32.2 g/dL (ref 32.0–36.0)
MCV: 83 fL (ref 80–100)
Monocyte #: 1 x10 3/mm (ref 0.2–1.0)
Neutrophil #: 10.3 x10 3/mm — ABNORMAL HIGH (ref 1.4–6.5)
Neutrophil %: 85.3 %
Platelet: 169 x10 3/mm (ref 150–440)
RBC: 3.68 10*6/uL — ABNORMAL LOW (ref 4.40–5.90)

## 2013-08-07 ENCOUNTER — Ambulatory Visit: Payer: Self-pay | Admitting: Oncology

## 2013-08-08 LAB — CBC CANCER CENTER
Basophil %: 0.2 %
HCT: 34.9 % — ABNORMAL LOW (ref 40.0–52.0)
HGB: 11.1 g/dL — ABNORMAL LOW (ref 13.0–18.0)
MCH: 26.8 pg (ref 26.0–34.0)
MCHC: 31.7 g/dL — ABNORMAL LOW (ref 32.0–36.0)
Monocyte #: 0.6 x10 3/mm (ref 0.2–1.0)
Monocyte %: 4.4 %
Neutrophil %: 89.9 %
Platelet: 186 x10 3/mm (ref 150–440)
RDW: 20.5 % — ABNORMAL HIGH (ref 11.5–14.5)
WBC: 14.5 x10 3/mm — ABNORMAL HIGH (ref 3.8–10.6)

## 2013-08-09 LAB — PSA: PSA: 111.5 ng/mL — ABNORMAL HIGH (ref 0.0–4.0)

## 2013-08-13 LAB — COMPREHENSIVE METABOLIC PANEL
Albumin: 3.3 g/dL — ABNORMAL LOW (ref 3.4–5.0)
Alkaline Phosphatase: 118 U/L (ref 50–136)
Anion Gap: 10 (ref 7–16)
BUN: 32 mg/dL — ABNORMAL HIGH (ref 7–18)
Bilirubin,Total: 0.3 mg/dL (ref 0.2–1.0)
Calcium, Total: 8.4 mg/dL — ABNORMAL LOW (ref 8.5–10.1)
Creatinine: 2.23 mg/dL — ABNORMAL HIGH (ref 0.60–1.30)
EGFR (African American): 34 — ABNORMAL LOW
EGFR (Non-African Amer.): 29 — ABNORMAL LOW
Glucose: 157 mg/dL — ABNORMAL HIGH (ref 65–99)
Potassium: 5.2 mmol/L — ABNORMAL HIGH (ref 3.5–5.1)
SGPT (ALT): 15 U/L (ref 12–78)
Sodium: 139 mmol/L (ref 136–145)

## 2013-08-13 LAB — CBC CANCER CENTER
Basophil #: 0 x10 3/mm (ref 0.0–0.1)
Basophil %: 0.2 %
Eosinophil #: 0 x10 3/mm (ref 0.0–0.7)
HCT: 33.6 % — ABNORMAL LOW (ref 40.0–52.0)
HGB: 10.7 g/dL — ABNORMAL LOW (ref 13.0–18.0)
Lymphocyte %: 4.9 %
MCH: 27 pg (ref 26.0–34.0)
MCV: 85 fL (ref 80–100)
Neutrophil #: 9.4 x10 3/mm — ABNORMAL HIGH (ref 1.4–6.5)
Neutrophil %: 86.9 %
Platelet: 195 x10 3/mm (ref 150–440)
RBC: 3.98 10*6/uL — ABNORMAL LOW (ref 4.40–5.90)
RDW: 20 % — ABNORMAL HIGH (ref 11.5–14.5)
WBC: 10.8 x10 3/mm — ABNORMAL HIGH (ref 3.8–10.6)

## 2013-08-22 LAB — CANCER CENTER HEMOGLOBIN: HGB: 10.9 g/dL — ABNORMAL LOW (ref 13.0–18.0)

## 2013-08-29 LAB — CANCER CENTER HEMOGLOBIN: HGB: 10.8 g/dL — ABNORMAL LOW (ref 13.0–18.0)

## 2013-09-05 LAB — COMPREHENSIVE METABOLIC PANEL
Albumin: 3.2 g/dL — ABNORMAL LOW (ref 3.4–5.0)
Alkaline Phosphatase: 106 U/L (ref 50–136)
Anion Gap: 8 (ref 7–16)
Bilirubin,Total: 0.4 mg/dL (ref 0.2–1.0)
Calcium, Total: 8.4 mg/dL — ABNORMAL LOW (ref 8.5–10.1)
Co2: 26 mmol/L (ref 21–32)
EGFR (African American): 33 — ABNORMAL LOW
EGFR (Non-African Amer.): 29 — ABNORMAL LOW
Osmolality: 291 (ref 275–301)
Potassium: 5.2 mmol/L — ABNORMAL HIGH (ref 3.5–5.1)
SGPT (ALT): 15 U/L (ref 12–78)

## 2013-09-05 LAB — CBC CANCER CENTER
Eosinophil #: 0 x10 3/mm (ref 0.0–0.7)
Eosinophil %: 0.4 %
HCT: 33.4 % — ABNORMAL LOW (ref 40.0–52.0)
Lymphocyte #: 0.5 x10 3/mm — ABNORMAL LOW (ref 1.0–3.6)
Lymphocyte %: 6 %
MCH: 26.5 pg (ref 26.0–34.0)
MCHC: 31.1 g/dL — ABNORMAL LOW (ref 32.0–36.0)
MCV: 85 fL (ref 80–100)
Monocyte #: 0.7 x10 3/mm (ref 0.2–1.0)
Monocyte %: 7.5 %
Neutrophil %: 85.7 %
RDW: 19.6 % — ABNORMAL HIGH (ref 11.5–14.5)
WBC: 8.7 x10 3/mm (ref 3.8–10.6)

## 2013-09-07 ENCOUNTER — Ambulatory Visit: Payer: Self-pay | Admitting: Oncology

## 2013-09-12 ENCOUNTER — Ambulatory Visit: Payer: Self-pay | Admitting: Oncology

## 2013-09-12 LAB — CANCER CENTER HEMOGLOBIN: HGB: 10 g/dL — ABNORMAL LOW (ref 13.0–18.0)

## 2013-09-23 ENCOUNTER — Ambulatory Visit: Payer: Self-pay | Admitting: Oncology

## 2013-09-25 LAB — COMPREHENSIVE METABOLIC PANEL
Alkaline Phosphatase: 115 U/L (ref 50–136)
Anion Gap: 6 — ABNORMAL LOW (ref 7–16)
BUN: 35 mg/dL — ABNORMAL HIGH (ref 7–18)
Calcium, Total: 8.6 mg/dL (ref 8.5–10.1)
Chloride: 107 mmol/L (ref 98–107)
Creatinine: 1.9 mg/dL — ABNORMAL HIGH (ref 0.60–1.30)
Potassium: 4.7 mmol/L (ref 3.5–5.1)
SGOT(AST): 28 U/L (ref 15–37)
SGPT (ALT): 16 U/L (ref 12–78)
Sodium: 140 mmol/L (ref 136–145)

## 2013-09-25 LAB — CBC
HCT: 32.1 % — ABNORMAL LOW (ref 40.0–52.0)
HGB: 10.4 g/dL — ABNORMAL LOW (ref 13.0–18.0)
MCH: 27.4 pg (ref 26.0–34.0)

## 2013-09-25 LAB — TROPONIN I: Troponin-I: <0.02 ng/mL

## 2013-09-26 ENCOUNTER — Inpatient Hospital Stay: Payer: Self-pay | Admitting: Internal Medicine

## 2013-09-26 LAB — URINALYSIS, COMPLETE
Bilirubin,UR: NEGATIVE
Blood: NEGATIVE
Ketone: NEGATIVE
Nitrite: NEGATIVE
Protein: 30
WBC UR: 42 /HPF (ref 0–5)

## 2013-09-26 LAB — LIPID PANEL: VLDL Cholesterol, Calc: 25 mg/dL (ref 5–40)

## 2013-09-27 LAB — CBC WITH DIFFERENTIAL/PLATELET
Basophil #: 0 10*3/uL (ref 0.0–0.1)
Eosinophil %: 0.6 %
HCT: 28.2 % — ABNORMAL LOW (ref 40.0–52.0)
Lymphocyte #: 0.7 10*3/uL — ABNORMAL LOW (ref 1.0–3.6)
MCH: 27.4 pg (ref 26.0–34.0)
MCV: 85 fL (ref 80–100)
Monocyte %: 11.1 %
Neutrophil %: 77.1 %
RBC: 3.34 10*6/uL — ABNORMAL LOW (ref 4.40–5.90)
RDW: 19.5 % — ABNORMAL HIGH (ref 11.5–14.5)
WBC: 6.9 10*3/uL (ref 3.8–10.6)

## 2013-10-07 ENCOUNTER — Ambulatory Visit: Payer: Self-pay | Admitting: Oncology

## 2013-10-10 LAB — CANCER CENTER HEMOGLOBIN: HGB: 11 g/dL — ABNORMAL LOW (ref 13.0–18.0)

## 2013-10-24 LAB — CBC CANCER CENTER
Basophil #: 0 x10 3/mm (ref 0.0–0.1)
Eosinophil %: 2.2 %
HCT: 31 % — ABNORMAL LOW (ref 40.0–52.0)
HGB: 9.6 g/dL — ABNORMAL LOW (ref 13.0–18.0)
Lymphocyte #: 0.5 x10 3/mm — ABNORMAL LOW (ref 1.0–3.6)
Lymphocyte %: 8.1 %
Monocyte #: 0.5 x10 3/mm (ref 0.2–1.0)
Neutrophil #: 5.1 x10 3/mm (ref 1.4–6.5)
Neutrophil %: 81.9 %
Platelet: 198 x10 3/mm (ref 150–440)

## 2013-10-24 LAB — COMPREHENSIVE METABOLIC PANEL
Albumin: 3 g/dL — ABNORMAL LOW (ref 3.4–5.0)
Alkaline Phosphatase: 277 U/L — ABNORMAL HIGH
Calcium, Total: 8.7 mg/dL (ref 8.5–10.1)
Chloride: 104 mmol/L (ref 98–107)
Co2: 27 mmol/L (ref 21–32)
Creatinine: 2.34 mg/dL — ABNORMAL HIGH (ref 0.60–1.30)
EGFR (African American): 32 — ABNORMAL LOW
EGFR (Non-African Amer.): 28 — ABNORMAL LOW
Glucose: 155 mg/dL — ABNORMAL HIGH (ref 65–99)
Osmolality: 289 (ref 275–301)
Potassium: 5.6 mmol/L — ABNORMAL HIGH (ref 3.5–5.1)
SGOT(AST): 19 U/L (ref 15–37)
Total Protein: 7.2 g/dL (ref 6.4–8.2)

## 2013-10-25 LAB — PSA: PSA: 205.8 ng/mL — ABNORMAL HIGH (ref 0.0–4.0)

## 2013-11-01 LAB — CANCER CENTER HEMOGLOBIN: HGB: 10.4 g/dL — ABNORMAL LOW (ref 13.0–18.0)

## 2013-11-07 ENCOUNTER — Ambulatory Visit: Payer: Self-pay | Admitting: Oncology

## 2013-11-17 IMAGING — NM NUCLEAR MEDICINE WHOLE BODY BONE SCINTIGRAPHY
2 series · 10 of 10 positions shown · non-contrast
Comparison: none

REASON FOR EXAM: rising PSA
COMMENTS:

[Series 1000: statics · 2.40mm/px · 4 acquisitions, 8 frames shown]
[im 1/4]
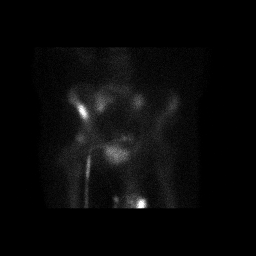
[im 1/4]
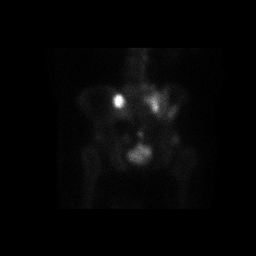
[im 2/4]
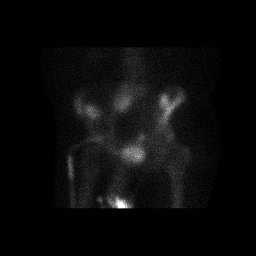
[im 2/4]
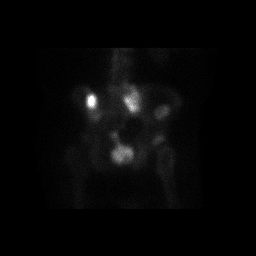
[im 3/4]
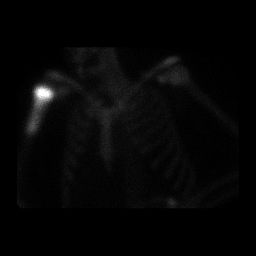
[im 3/4]
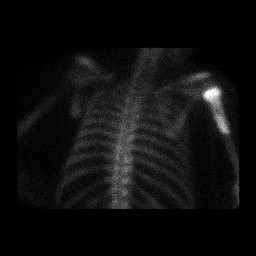
[im 4/4]
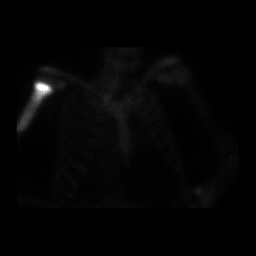
[im 4/4]
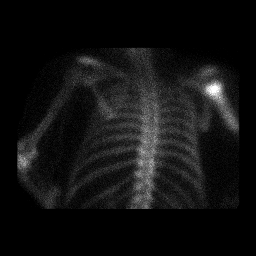

[Series 1000: 3 hr wholebody · 2.40mm/px · 2 of 2 frames shown]
[frame 1/2]
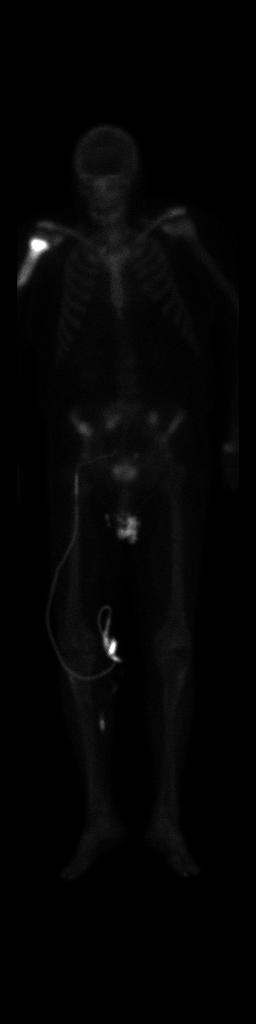
[frame 2/2]
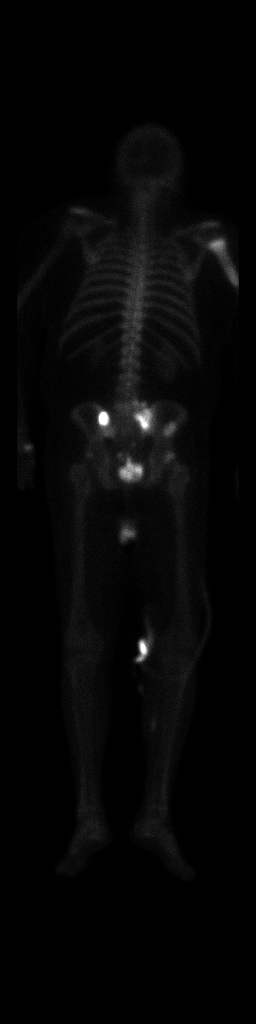

[10 of 10 positions shown; findings below may reference images not displayed]

PROCEDURE:     KNM - KNM BONE WB 3HR [DATE]  [DATE]

RESULT:     The patient has a history of prostatic malignancy is exhibiting
an increasing PSA level. The patient received 23.37 mCi of technetium 99m
labeled MDP intravenously. Static delayed bone imaging was then performed in
the usual fashion. There is adequate uptake of the radiopharmaceutical by
the skeleton. Adequate soft tissue clearance and renal activity is
demonstrated. A urinary catheter and drainage bag are present.

There is new diffusely increased uptake associated with the proximal humerus
on the right. Activity over the calvarium, scapulae, ribs, sternum, and
spine is within the limits of normal with the exception of a small amount of
increased uptake associated with posterior elements of L5. There is
increased uptake within the SI joints especially on the right that is more
conspicuous than in the past. There is new increased uptake along the
lateral aspect of the right iliac wing and in the roof of the right
acetabulum. There are small foci of increased uptake within the sacrum.
There is a tiny focus of increased uptake associated with the roof of the
left acetabulum. There is mildly increased uptake associated with the soft
tissues of the mid calf on the right which may reflect urinary contamination.
IMPRESSION: 1. There is new diffuse increased uptake in the proximal right humerus.
2. New foci of increased uptake within the pelvis and in the sacrum are
demonstrated. There is activity in the SI joints bilaterally which has
increased in conspicuity as well. A small amount of increased uptake
associated with the posterior elements at L5 on the right is present as
well.

These findings are consistent with metastatic disease to the bone.

[REDACTED]

## 2013-11-18 LAB — COMPREHENSIVE METABOLIC PANEL
ALBUMIN: 3.3 g/dL — AB (ref 3.4–5.0)
ALT: 15 U/L (ref 12–78)
ANION GAP: 8 (ref 7–16)
Alkaline Phosphatase: 751 U/L — ABNORMAL HIGH
BILIRUBIN TOTAL: 0.2 mg/dL (ref 0.2–1.0)
BUN: 37 mg/dL — ABNORMAL HIGH (ref 7–18)
CO2: 28 mmol/L (ref 21–32)
Calcium, Total: 9.9 mg/dL (ref 8.5–10.1)
Chloride: 102 mmol/L (ref 98–107)
Creatinine: 2.17 mg/dL — ABNORMAL HIGH (ref 0.60–1.30)
EGFR (African American): 35 — ABNORMAL LOW
EGFR (Non-African Amer.): 30 — ABNORMAL LOW
Glucose: 126 mg/dL — ABNORMAL HIGH (ref 65–99)
Osmolality: 286 (ref 275–301)
Potassium: 6 mmol/L — ABNORMAL HIGH (ref 3.5–5.1)
SGOT(AST): 75 U/L — ABNORMAL HIGH (ref 15–37)
SODIUM: 138 mmol/L (ref 136–145)
Total Protein: 7.6 g/dL (ref 6.4–8.2)

## 2013-11-18 LAB — CBC CANCER CENTER
Basophil #: 0 x10 3/mm (ref 0.0–0.1)
Basophil %: 0.2 %
Eosinophil #: 0.1 x10 3/mm (ref 0.0–0.7)
Eosinophil %: 0.7 %
HCT: 32.8 % — ABNORMAL LOW (ref 40.0–52.0)
HGB: 10 g/dL — AB (ref 13.0–18.0)
LYMPHS ABS: 0.6 x10 3/mm — AB (ref 1.0–3.6)
LYMPHS PCT: 6.3 %
MCH: 24.7 pg — ABNORMAL LOW (ref 26.0–34.0)
MCHC: 30.5 g/dL — ABNORMAL LOW (ref 32.0–36.0)
MCV: 81 fL (ref 80–100)
Monocyte #: 0.7 x10 3/mm (ref 0.2–1.0)
Monocyte %: 7.7 %
NEUTROS PCT: 85.1 %
Neutrophil #: 8.1 x10 3/mm — ABNORMAL HIGH (ref 1.4–6.5)
Platelet: 224 x10 3/mm (ref 150–440)
RBC: 4.05 10*6/uL — AB (ref 4.40–5.90)
RDW: 18.6 % — AB (ref 11.5–14.5)
WBC: 9.5 x10 3/mm (ref 3.8–10.6)

## 2013-12-04 LAB — COMPREHENSIVE METABOLIC PANEL
ALBUMIN: 3 g/dL — AB (ref 3.4–5.0)
ANION GAP: 5 — AB (ref 7–16)
Alkaline Phosphatase: 663 U/L — ABNORMAL HIGH
BUN: 30 mg/dL — AB (ref 7–18)
Bilirubin,Total: 0.2 mg/dL (ref 0.2–1.0)
CALCIUM: 10 mg/dL (ref 8.5–10.1)
CHLORIDE: 102 mmol/L (ref 98–107)
CO2: 28 mmol/L (ref 21–32)
Creatinine: 2.17 mg/dL — ABNORMAL HIGH (ref 0.60–1.30)
EGFR (African American): 35 — ABNORMAL LOW
EGFR (Non-African Amer.): 30 — ABNORMAL LOW
Glucose: 188 mg/dL — ABNORMAL HIGH (ref 65–99)
Osmolality: 281 (ref 275–301)
POTASSIUM: 5.5 mmol/L — AB (ref 3.5–5.1)
SGOT(AST): 29 U/L (ref 15–37)
SGPT (ALT): 11 U/L — ABNORMAL LOW (ref 12–78)
Sodium: 135 mmol/L — ABNORMAL LOW (ref 136–145)
Total Protein: 7.5 g/dL (ref 6.4–8.2)

## 2013-12-04 LAB — CBC CANCER CENTER
BASOS PCT: 0.3 %
Basophil #: 0 x10 3/mm (ref 0.0–0.1)
EOS ABS: 0.1 x10 3/mm (ref 0.0–0.7)
Eosinophil %: 1.3 %
HCT: 30.4 % — ABNORMAL LOW (ref 40.0–52.0)
HGB: 9.4 g/dL — ABNORMAL LOW (ref 13.0–18.0)
Lymphocyte #: 0.6 x10 3/mm — ABNORMAL LOW (ref 1.0–3.6)
Lymphocyte %: 7.8 %
MCH: 24.4 pg — ABNORMAL LOW (ref 26.0–34.0)
MCHC: 30.9 g/dL — AB (ref 32.0–36.0)
MCV: 79 fL — ABNORMAL LOW (ref 80–100)
MONO ABS: 0.6 x10 3/mm (ref 0.2–1.0)
MONOS PCT: 8 %
NEUTROS PCT: 82.6 %
Neutrophil #: 6.5 x10 3/mm (ref 1.4–6.5)
PLATELETS: 246 x10 3/mm (ref 150–440)
RBC: 3.85 10*6/uL — AB (ref 4.40–5.90)
RDW: 18.3 % — AB (ref 11.5–14.5)
WBC: 7.8 x10 3/mm (ref 3.8–10.6)

## 2013-12-05 LAB — PSA: PSA: 405.9 ng/mL — AB (ref 0.0–4.0)

## 2013-12-08 ENCOUNTER — Ambulatory Visit: Payer: Self-pay | Admitting: Oncology

## 2013-12-11 LAB — CBC CANCER CENTER
BASOS ABS: 0 x10 3/mm (ref 0.0–0.1)
BASOS PCT: 0.4 %
EOS ABS: 0 x10 3/mm (ref 0.0–0.7)
Eosinophil %: 0.5 %
HCT: 31.5 % — ABNORMAL LOW (ref 40.0–52.0)
HGB: 9.6 g/dL — ABNORMAL LOW (ref 13.0–18.0)
LYMPHS ABS: 0.4 x10 3/mm — AB (ref 1.0–3.6)
LYMPHS PCT: 5.4 %
MCH: 24.1 pg — AB (ref 26.0–34.0)
MCHC: 30.6 g/dL — AB (ref 32.0–36.0)
MCV: 79 fL — ABNORMAL LOW (ref 80–100)
MONO ABS: 0.5 x10 3/mm (ref 0.2–1.0)
Monocyte %: 6 %
NEUTROS ABS: 7.3 x10 3/mm — AB (ref 1.4–6.5)
NEUTROS PCT: 87.7 %
Platelet: 213 x10 3/mm (ref 150–440)
RBC: 4 10*6/uL — ABNORMAL LOW (ref 4.40–5.90)
RDW: 19 % — ABNORMAL HIGH (ref 11.5–14.5)
WBC: 8.3 x10 3/mm (ref 3.8–10.6)

## 2013-12-18 LAB — CBC CANCER CENTER
BASOS ABS: 0 x10 3/mm (ref 0.0–0.1)
Basophil %: 0.6 %
EOS ABS: 0.1 x10 3/mm (ref 0.0–0.7)
Eosinophil %: 1.4 %
HCT: 34.1 % — AB (ref 40.0–52.0)
HGB: 10.4 g/dL — ABNORMAL LOW (ref 13.0–18.0)
LYMPHS ABS: 0.6 x10 3/mm — AB (ref 1.0–3.6)
Lymphocyte %: 10.7 %
MCH: 23.9 pg — ABNORMAL LOW (ref 26.0–34.0)
MCHC: 30.5 g/dL — ABNORMAL LOW (ref 32.0–36.0)
MCV: 78 fL — ABNORMAL LOW (ref 80–100)
MONO ABS: 0.5 x10 3/mm (ref 0.2–1.0)
Monocyte %: 8.7 %
NEUTROS ABS: 4.3 x10 3/mm (ref 1.4–6.5)
Neutrophil %: 78.6 %
Platelet: 244 x10 3/mm (ref 150–440)
RBC: 4.36 10*6/uL — ABNORMAL LOW (ref 4.40–5.90)
RDW: 19.4 % — ABNORMAL HIGH (ref 11.5–14.5)
WBC: 5.4 x10 3/mm (ref 3.8–10.6)

## 2013-12-25 LAB — COMPREHENSIVE METABOLIC PANEL
ALT: 14 U/L (ref 12–78)
ANION GAP: 11 (ref 7–16)
AST: 26 U/L (ref 15–37)
Albumin: 3.1 g/dL — ABNORMAL LOW (ref 3.4–5.0)
Alkaline Phosphatase: 415 U/L — ABNORMAL HIGH
BUN: 28 mg/dL — AB (ref 7–18)
Bilirubin,Total: 0.3 mg/dL (ref 0.2–1.0)
Calcium, Total: 8.5 mg/dL (ref 8.5–10.1)
Chloride: 100 mmol/L (ref 98–107)
Co2: 25 mmol/L (ref 21–32)
Creatinine: 2.26 mg/dL — ABNORMAL HIGH (ref 0.60–1.30)
EGFR (African American): 33 — ABNORMAL LOW
GFR CALC NON AF AMER: 29 — AB
GLUCOSE: 276 mg/dL — AB (ref 65–99)
Osmolality: 287 (ref 275–301)
Potassium: 5.5 mmol/L — ABNORMAL HIGH (ref 3.5–5.1)
SODIUM: 136 mmol/L (ref 136–145)
Total Protein: 7.6 g/dL (ref 6.4–8.2)

## 2013-12-25 LAB — CBC CANCER CENTER
BASOS PCT: 0.2 %
Basophil #: 0 x10 3/mm (ref 0.0–0.1)
EOS ABS: 0.1 x10 3/mm (ref 0.0–0.7)
EOS PCT: 1 %
HCT: 33.1 % — AB (ref 40.0–52.0)
HGB: 10.6 g/dL — AB (ref 13.0–18.0)
Lymphocyte #: 0.5 x10 3/mm — ABNORMAL LOW (ref 1.0–3.6)
Lymphocyte %: 8.4 %
MCH: 25 pg — ABNORMAL LOW (ref 26.0–34.0)
MCHC: 31.9 g/dL — ABNORMAL LOW (ref 32.0–36.0)
MCV: 78 fL — ABNORMAL LOW (ref 80–100)
Monocyte #: 0.4 x10 3/mm (ref 0.2–1.0)
Monocyte %: 7.5 %
Neutrophil #: 4.8 x10 3/mm (ref 1.4–6.5)
Neutrophil %: 82.9 %
Platelet: 207 x10 3/mm (ref 150–440)
RBC: 4.23 10*6/uL — AB (ref 4.40–5.90)
RDW: 19.5 % — AB (ref 11.5–14.5)
WBC: 5.8 x10 3/mm (ref 3.8–10.6)

## 2013-12-26 LAB — PSA: PSA: 551.6 ng/mL — ABNORMAL HIGH (ref 0.0–4.0)

## 2014-01-01 LAB — CANCER CENTER HEMOGLOBIN: HGB: 10.6 g/dL — AB (ref 13.0–18.0)

## 2014-01-05 ENCOUNTER — Ambulatory Visit: Payer: Self-pay | Admitting: Oncology

## 2014-01-08 LAB — CANCER CENTER HEMOGLOBIN: HGB: 9 g/dL — ABNORMAL LOW (ref 13.0–18.0)

## 2014-01-15 LAB — CANCER CENTER HEMOGLOBIN: HGB: 9.4 g/dL — ABNORMAL LOW (ref 13.0–18.0)

## 2014-01-19 ENCOUNTER — Inpatient Hospital Stay: Payer: Self-pay | Admitting: Internal Medicine

## 2014-01-19 LAB — CBC WITH DIFFERENTIAL/PLATELET
BASOS ABS: 0 10*3/uL (ref 0.0–0.1)
Basophil %: 0.2 %
Eosinophil #: 0 10*3/uL (ref 0.0–0.7)
Eosinophil %: 0 %
HCT: 28 % — ABNORMAL LOW (ref 40.0–52.0)
HGB: 8.8 g/dL — ABNORMAL LOW (ref 13.0–18.0)
LYMPHS ABS: 0.1 10*3/uL — AB (ref 1.0–3.6)
Lymphocyte %: 3.2 %
MCH: 24.5 pg — AB (ref 26.0–34.0)
MCHC: 31.5 g/dL — AB (ref 32.0–36.0)
MCV: 78 fL — ABNORMAL LOW (ref 80–100)
MONOS PCT: 3.3 %
Monocyte #: 0.1 x10 3/mm — ABNORMAL LOW (ref 0.2–1.0)
NEUTROS ABS: 4 10*3/uL (ref 1.4–6.5)
Neutrophil %: 93.3 %
Platelet: 157 10*3/uL (ref 150–440)
RBC: 3.6 10*6/uL — AB (ref 4.40–5.90)
RDW: 20.5 % — ABNORMAL HIGH (ref 11.5–14.5)
WBC: 4.3 10*3/uL (ref 3.8–10.6)

## 2014-01-19 LAB — COMPREHENSIVE METABOLIC PANEL
ALBUMIN: 2.4 g/dL — AB (ref 3.4–5.0)
ALT: 10 U/L — AB (ref 12–78)
AST: 34 U/L (ref 15–37)
Alkaline Phosphatase: 362 U/L — ABNORMAL HIGH
Anion Gap: 4 — ABNORMAL LOW (ref 7–16)
BILIRUBIN TOTAL: 0.4 mg/dL (ref 0.2–1.0)
BUN: 24 mg/dL — AB (ref 7–18)
CHLORIDE: 103 mmol/L (ref 98–107)
CO2: 28 mmol/L (ref 21–32)
Calcium, Total: 8.8 mg/dL (ref 8.5–10.1)
Creatinine: 1.71 mg/dL — ABNORMAL HIGH (ref 0.60–1.30)
EGFR (Non-African Amer.): 40 — ABNORMAL LOW
GFR CALC AF AMER: 47 — AB
GLUCOSE: 249 mg/dL — AB (ref 65–99)
OSMOLALITY: 283 (ref 275–301)
POTASSIUM: 5 mmol/L (ref 3.5–5.1)
Sodium: 135 mmol/L — ABNORMAL LOW (ref 136–145)
Total Protein: 7.1 g/dL (ref 6.4–8.2)

## 2014-01-19 LAB — URINALYSIS, COMPLETE
Bilirubin,UR: NEGATIVE
Blood: NEGATIVE
Glucose,UR: 150 mg/dL (ref 0–75)
KETONE: NEGATIVE
Nitrite: NEGATIVE
Ph: 7 (ref 4.5–8.0)
RBC,UR: 2 /HPF (ref 0–5)
Specific Gravity: 1.016 (ref 1.003–1.030)
Squamous Epithelial: NONE SEEN
WBC UR: 40 /HPF (ref 0–5)

## 2014-01-19 LAB — TROPONIN I

## 2014-01-20 LAB — BASIC METABOLIC PANEL
ANION GAP: 6 — AB (ref 7–16)
BUN: 20 mg/dL — ABNORMAL HIGH (ref 7–18)
CHLORIDE: 107 mmol/L (ref 98–107)
CO2: 25 mmol/L (ref 21–32)
CREATININE: 1.48 mg/dL — AB (ref 0.60–1.30)
Calcium, Total: 8.7 mg/dL (ref 8.5–10.1)
EGFR (African American): 56 — ABNORMAL LOW
GFR CALC NON AF AMER: 48 — AB
GLUCOSE: 121 mg/dL — AB (ref 65–99)
Osmolality: 280 (ref 275–301)
POTASSIUM: 4.4 mmol/L (ref 3.5–5.1)
SODIUM: 138 mmol/L (ref 136–145)

## 2014-01-20 LAB — CBC WITH DIFFERENTIAL/PLATELET
BASOS ABS: 0 10*3/uL (ref 0.0–0.1)
Basophil %: 0.1 %
EOS PCT: 0.3 %
Eosinophil #: 0 10*3/uL (ref 0.0–0.7)
HCT: 26.5 % — AB (ref 40.0–52.0)
HGB: 8.4 g/dL — ABNORMAL LOW (ref 13.0–18.0)
LYMPHS ABS: 0.2 10*3/uL — AB (ref 1.0–3.6)
LYMPHS PCT: 4.9 %
MCH: 24.6 pg — AB (ref 26.0–34.0)
MCHC: 31.7 g/dL — AB (ref 32.0–36.0)
MCV: 78 fL — ABNORMAL LOW (ref 80–100)
Monocyte #: 0.3 x10 3/mm (ref 0.2–1.0)
Monocyte %: 6.7 %
Neutrophil #: 3.9 10*3/uL (ref 1.4–6.5)
Neutrophil %: 88 %
PLATELETS: 156 10*3/uL (ref 150–440)
RBC: 3.41 10*6/uL — ABNORMAL LOW (ref 4.40–5.90)
RDW: 20.5 % — AB (ref 11.5–14.5)
WBC: 4.4 10*3/uL (ref 3.8–10.6)

## 2014-01-20 LAB — APTT: ACTIVATED PTT: 28.8 s (ref 23.6–35.9)

## 2014-01-21 LAB — CBC WITH DIFFERENTIAL/PLATELET
Basophil #: 0 10*3/uL (ref 0.0–0.1)
Basophil %: 0.3 %
EOS ABS: 0 10*3/uL (ref 0.0–0.7)
Eosinophil %: 0.9 %
HCT: 27.8 % — ABNORMAL LOW (ref 40.0–52.0)
HGB: 8.6 g/dL — ABNORMAL LOW (ref 13.0–18.0)
LYMPHS ABS: 0.3 10*3/uL — AB (ref 1.0–3.6)
Lymphocyte %: 5.6 %
MCH: 24.3 pg — ABNORMAL LOW (ref 26.0–34.0)
MCHC: 30.9 g/dL — AB (ref 32.0–36.0)
MCV: 79 fL — ABNORMAL LOW (ref 80–100)
MONOS PCT: 7.3 %
Monocyte #: 0.4 x10 3/mm (ref 0.2–1.0)
NEUTROS ABS: 4.6 10*3/uL (ref 1.4–6.5)
Neutrophil %: 85.9 %
Platelet: 159 10*3/uL (ref 150–440)
RBC: 3.54 10*6/uL — AB (ref 4.40–5.90)
RDW: 20.6 % — ABNORMAL HIGH (ref 11.5–14.5)
WBC: 5.4 10*3/uL (ref 3.8–10.6)

## 2014-01-21 LAB — BASIC METABOLIC PANEL
Anion Gap: 3 — ABNORMAL LOW (ref 7–16)
BUN: 22 mg/dL — ABNORMAL HIGH (ref 7–18)
CALCIUM: 8.6 mg/dL (ref 8.5–10.1)
CO2: 28 mmol/L (ref 21–32)
CREATININE: 1.66 mg/dL — AB (ref 0.60–1.30)
Chloride: 105 mmol/L (ref 98–107)
EGFR (Non-African Amer.): 42 — ABNORMAL LOW
GFR CALC AF AMER: 48 — AB
Glucose: 125 mg/dL — ABNORMAL HIGH (ref 65–99)
Osmolality: 277 (ref 275–301)
Potassium: 4.8 mmol/L (ref 3.5–5.1)
Sodium: 136 mmol/L (ref 136–145)

## 2014-01-21 LAB — FERRITIN: Ferritin (ARMC): 662 ng/mL — ABNORMAL HIGH (ref 8–388)

## 2014-01-21 LAB — IRON AND TIBC
IRON BIND. CAP.(TOTAL): 191 ug/dL — AB (ref 250–450)
IRON SATURATION: 19 %
Iron: 36 ug/dL — ABNORMAL LOW (ref 65–175)
UNBOUND IRON-BIND. CAP.: 155 ug/dL

## 2014-01-23 LAB — BASIC METABOLIC PANEL
ANION GAP: 4 — AB (ref 7–16)
BUN: 24 mg/dL — AB (ref 7–18)
CALCIUM: 8.6 mg/dL (ref 8.5–10.1)
Chloride: 103 mmol/L (ref 98–107)
Co2: 27 mmol/L (ref 21–32)
Creatinine: 1.79 mg/dL — ABNORMAL HIGH (ref 0.60–1.30)
EGFR (African American): 44 — ABNORMAL LOW
EGFR (Non-African Amer.): 38 — ABNORMAL LOW
Glucose: 166 mg/dL — ABNORMAL HIGH (ref 65–99)
Osmolality: 276 (ref 275–301)
Potassium: 4.6 mmol/L (ref 3.5–5.1)
SODIUM: 134 mmol/L — AB (ref 136–145)

## 2014-01-23 LAB — CBC WITH DIFFERENTIAL/PLATELET
BASOS ABS: 0 10*3/uL (ref 0.0–0.1)
BASOS PCT: 1.1 %
EOS PCT: 0.6 %
Eosinophil #: 0 10*3/uL (ref 0.0–0.7)
HCT: 26.7 % — ABNORMAL LOW (ref 40.0–52.0)
HGB: 8.4 g/dL — AB (ref 13.0–18.0)
Lymphocyte #: 0.2 10*3/uL — ABNORMAL LOW (ref 1.0–3.6)
Lymphocyte %: 3.7 %
MCH: 24.5 pg — ABNORMAL LOW (ref 26.0–34.0)
MCHC: 31.5 g/dL — AB (ref 32.0–36.0)
MCV: 78 fL — AB (ref 80–100)
MONO ABS: 0.3 x10 3/mm (ref 0.2–1.0)
Monocyte %: 7.7 %
NEUTROS PCT: 86.9 %
Neutrophil #: 3.9 10*3/uL (ref 1.4–6.5)
Platelet: 144 10*3/uL — ABNORMAL LOW (ref 150–440)
RBC: 3.44 10*6/uL — AB (ref 4.40–5.90)
RDW: 20.5 % — ABNORMAL HIGH (ref 11.5–14.5)
WBC: 4.5 10*3/uL (ref 3.8–10.6)

## 2014-01-23 LAB — MAGNESIUM: Magnesium: 1.9 mg/dL

## 2014-01-24 LAB — LIPID PANEL
Cholesterol: 110 mg/dL (ref 0–200)
HDL Cholesterol: 47 mg/dL (ref 40–60)
Ldl Cholesterol, Calc: 34 mg/dL (ref 0–100)
Triglycerides: 146 mg/dL (ref 0–200)
VLDL Cholesterol, Calc: 29 mg/dL (ref 5–40)

## 2014-01-26 LAB — HEMOGLOBIN: HGB: 8.5 g/dL — ABNORMAL LOW (ref 13.0–18.0)

## 2014-01-27 LAB — CREATININE, SERUM
CREATININE: 1.56 mg/dL — AB (ref 0.60–1.30)
GFR CALC AF AMER: 52 — AB
GFR CALC NON AF AMER: 45 — AB

## 2014-01-29 LAB — URINE CULTURE

## 2014-02-05 ENCOUNTER — Ambulatory Visit: Payer: Self-pay | Admitting: Oncology

## 2014-02-06 LAB — COMPREHENSIVE METABOLIC PANEL
ALK PHOS: 626 U/L — AB
Albumin: 2.3 g/dL — ABNORMAL LOW (ref 3.4–5.0)
Anion Gap: 11 (ref 7–16)
BILIRUBIN TOTAL: 0.3 mg/dL (ref 0.2–1.0)
BUN: 64 mg/dL — ABNORMAL HIGH (ref 7–18)
CHLORIDE: 100 mmol/L (ref 98–107)
CREATININE: 2.05 mg/dL — AB (ref 0.60–1.30)
Calcium, Total: 9.2 mg/dL (ref 8.5–10.1)
Co2: 21 mmol/L (ref 21–32)
EGFR (African American): 37 — ABNORMAL LOW
EGFR (Non-African Amer.): 32 — ABNORMAL LOW
GLUCOSE: 162 mg/dL — AB (ref 65–99)
OSMOLALITY: 286 (ref 275–301)
Potassium: 5.9 mmol/L — ABNORMAL HIGH (ref 3.5–5.1)
SGOT(AST): 21 U/L (ref 15–37)
SGPT (ALT): 18 U/L (ref 12–78)
SODIUM: 132 mmol/L — AB (ref 136–145)
TOTAL PROTEIN: 6.8 g/dL (ref 6.4–8.2)

## 2014-02-06 LAB — CBC CANCER CENTER
BASOS ABS: 0 x10 3/mm (ref 0.0–0.1)
BASOS PCT: 0.3 %
EOS ABS: 0 x10 3/mm (ref 0.0–0.7)
Eosinophil %: 0 %
HCT: 34.9 % — AB (ref 40.0–52.0)
HGB: 10.9 g/dL — AB (ref 13.0–18.0)
LYMPHS PCT: 3.9 %
Lymphocyte #: 0.4 x10 3/mm — ABNORMAL LOW (ref 1.0–3.6)
MCH: 24.8 pg — ABNORMAL LOW (ref 26.0–34.0)
MCHC: 31.2 g/dL — ABNORMAL LOW (ref 32.0–36.0)
MCV: 80 fL (ref 80–100)
Monocyte #: 0.5 x10 3/mm (ref 0.2–1.0)
Monocyte %: 5.3 %
NEUTROS ABS: 8.8 x10 3/mm — AB (ref 1.4–6.5)
NEUTROS PCT: 90.5 %
Platelet: 220 x10 3/mm (ref 150–440)
RBC: 4.38 10*6/uL — AB (ref 4.40–5.90)
RDW: 24 % — ABNORMAL HIGH (ref 11.5–14.5)
WBC: 9.8 x10 3/mm (ref 3.8–10.6)

## 2014-03-07 ENCOUNTER — Ambulatory Visit: Payer: Self-pay | Admitting: Oncology

## 2014-03-07 DEATH — deceased

## 2014-09-28 IMAGING — CT CT HEAD WITHOUT CONTRAST
1 series · 16 of 30 positions shown, 20 images · non-contrast
Comparison: 09/25/2013

CLINICAL DATA: 24 hr followup for acute CVA.

EXAM:
CT HEAD WITHOUT CONTRAST
TECHNIQUE: Contiguous axial images were obtained from the base of the skull
through the vertex without intravenous contrast.

[Series 2: head wo · axial · 0.41mm/px · z∈[+1206,+1354]mm · 16 of 36 slices shown, 20 images]
[im 2/36  brain]
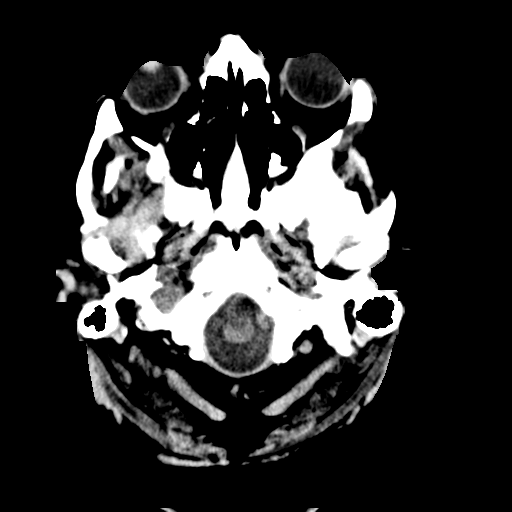
[im 2/36  bone]
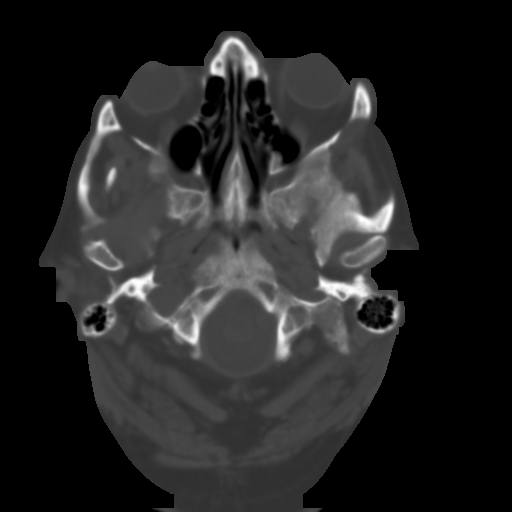
[im 4/36  brain]
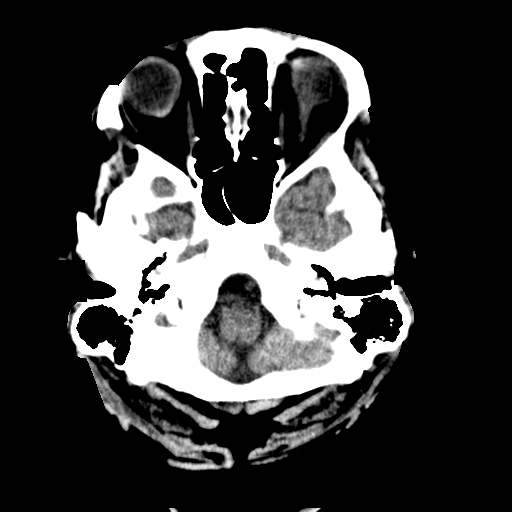
[im 7/36  brain]
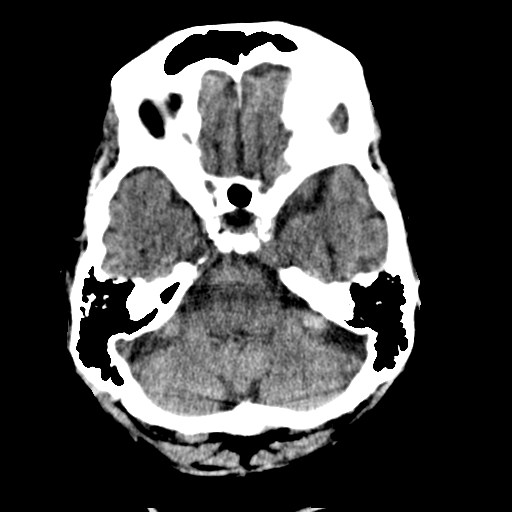
[im 9/36  brain]
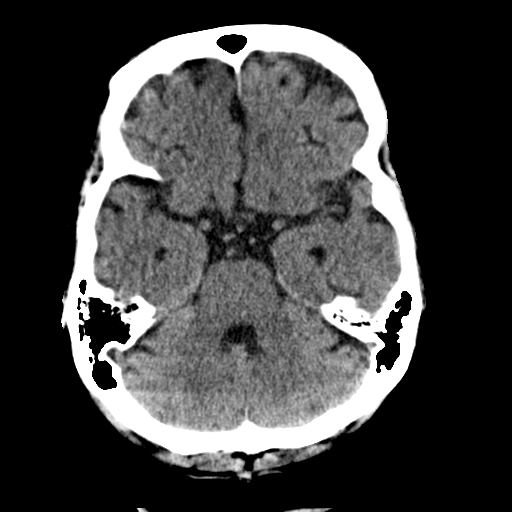
[im 10/36  brain]
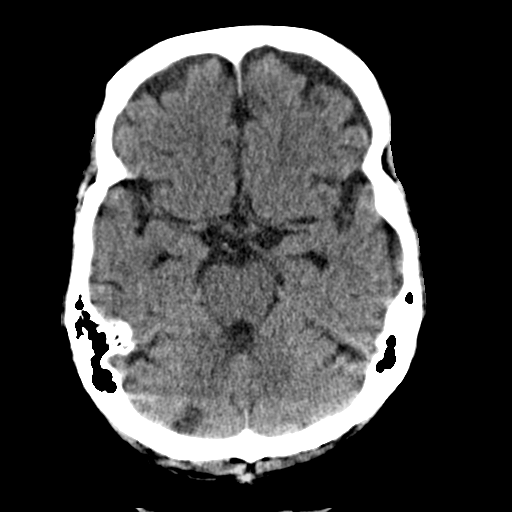
[im 10/36  bone]
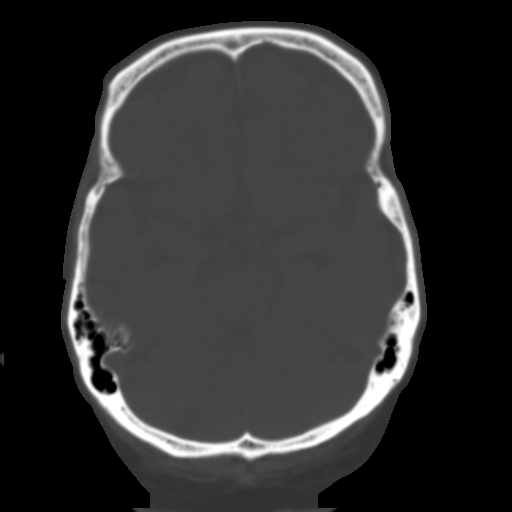
[im 13/36  brain]
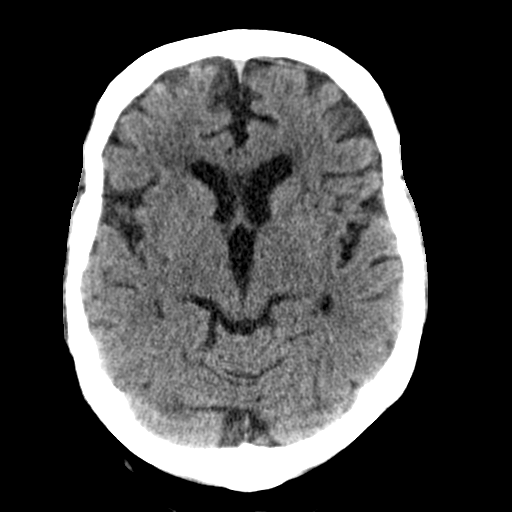
[im 15/36  brain]
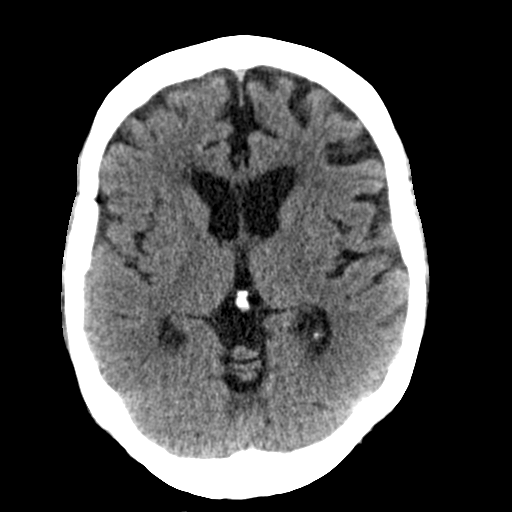
[im 17/36  brain]
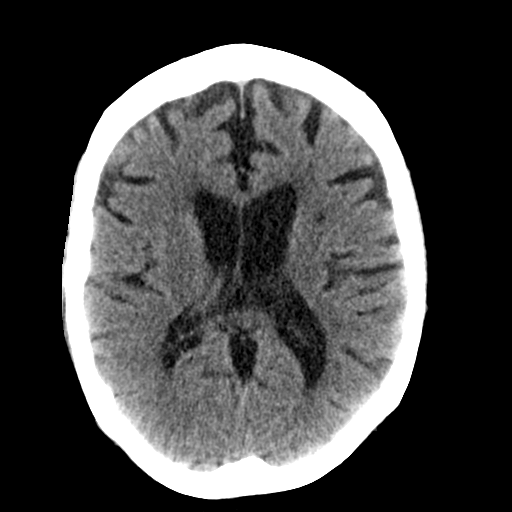
[im 19/36  brain]
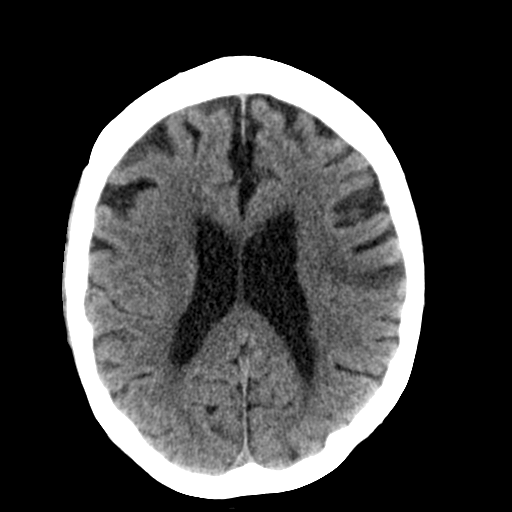
[im 19/36  bone]
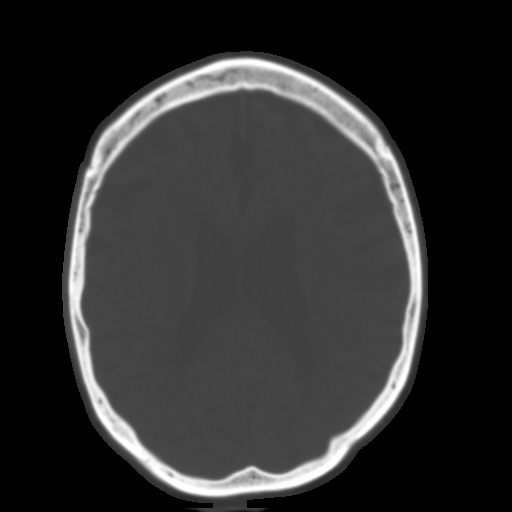
[im 21/36  brain]
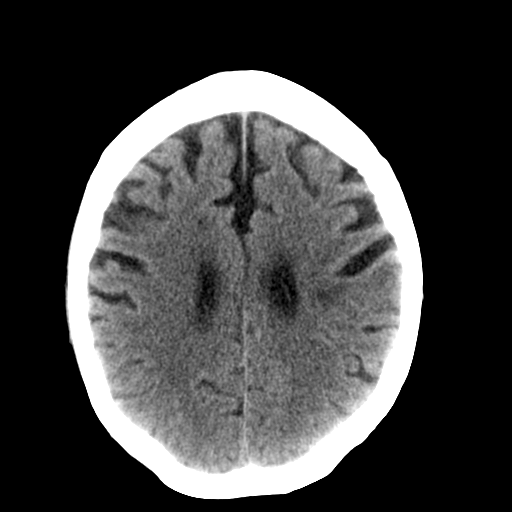
[im 23/36  brain]
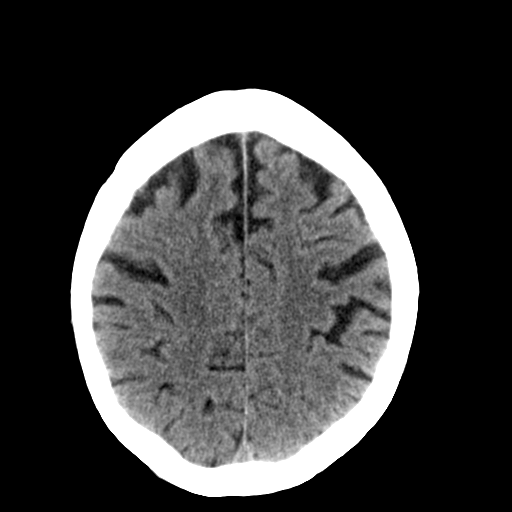
[im 26/36  brain]
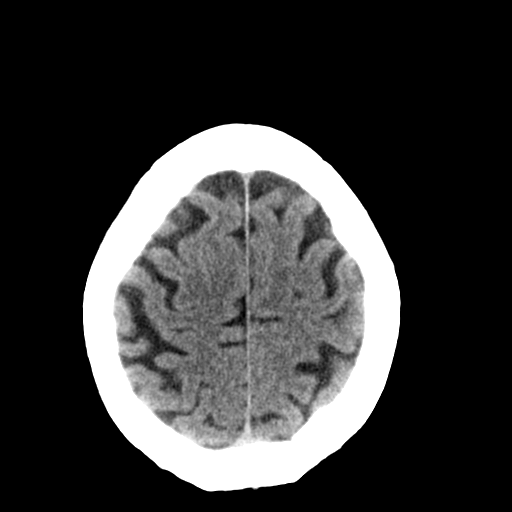
[im 27/36  brain]
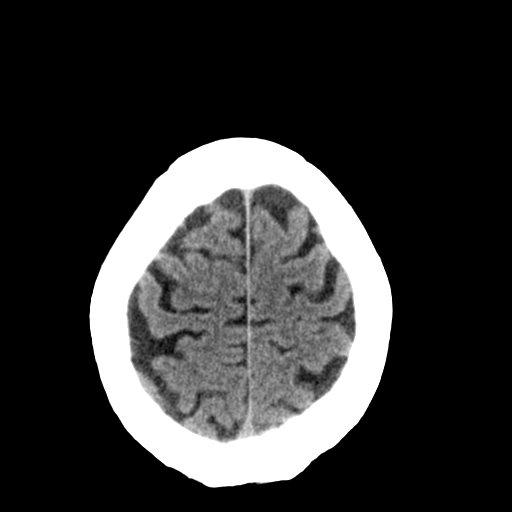
[im 27/36  bone]
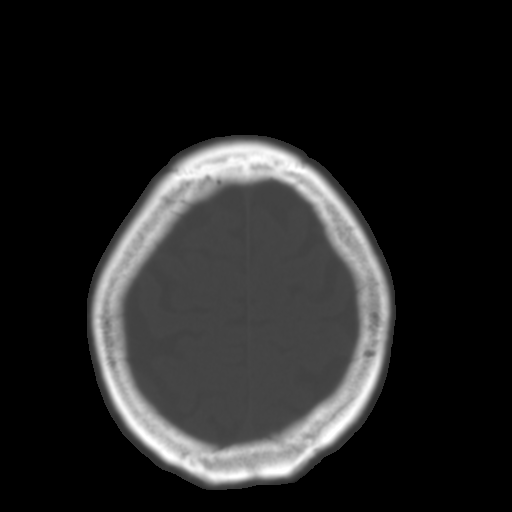
[im 29/36  brain]
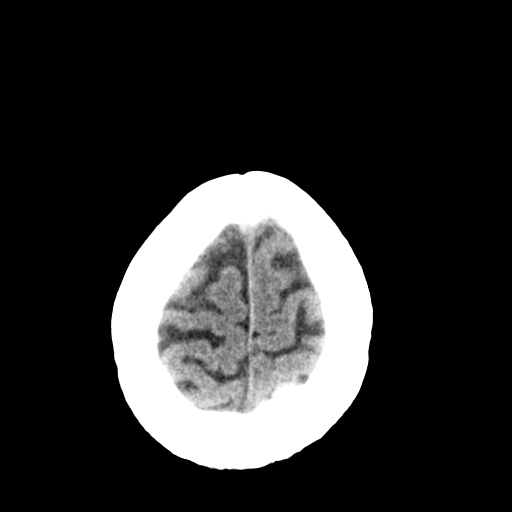
[im 32/36  brain]
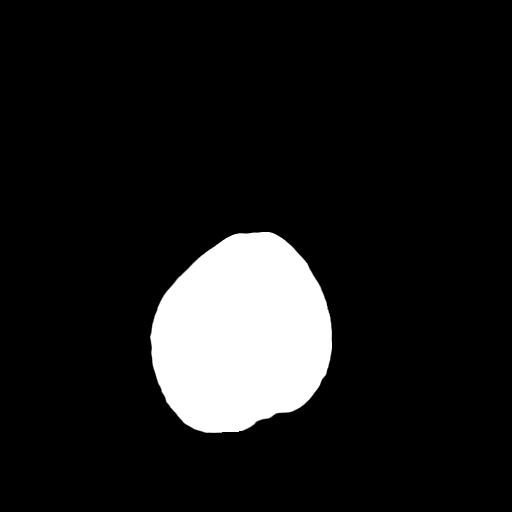
[im 34/36  brain]
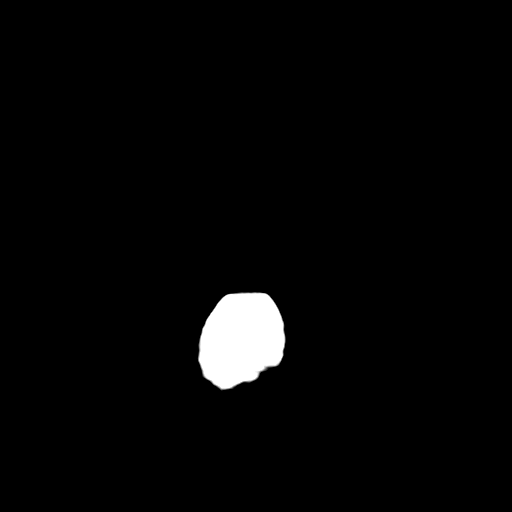

[16 of 30 positions shown; findings below may reference images not displayed]

FINDINGS: Small suspected remote infarct of the right upper cerebellum.
Brainstem, cerebral peduncles, thalami, and basal ganglia
unremarkable. Basilar cisterns and ventricular system normal.

Along the previously seen hypodensity in the left frontal lobe,
there is now discernible loss of gray-white junction along several
gyri is observed on images 18 through 20 of series 2. No additional
or significant new region of involvement is observed. No hemorrhagic
transformation. No mass lesion.
IMPRESSION: 1. Subacute left frontal lobe infarct, similar distribution as prior
although loss of gray-white junction is now more readily apparent.
This is compatible with expected evolutionary changes. No
intracranial hemorrhage.

## 2014-09-28 IMAGING — US US CAROTID DUPLEX BILAT
1 series · 13 of 24 positions shown · non-contrast
Comparison: 10/02/2011

CLINICAL DATA: Stroke

EXAM:
BILATERAL CAROTID DUPLEX ULTRASOUND
TECHNIQUE: Gray scale imaging, color Doppler and duplex ultrasound were
performed of bilateral carotid and vertebral arteries in the neck.

[Series 1: us carotid duplex bilat · 0.07mm/px · 13 of 56 slices shown]
[im 1/56]
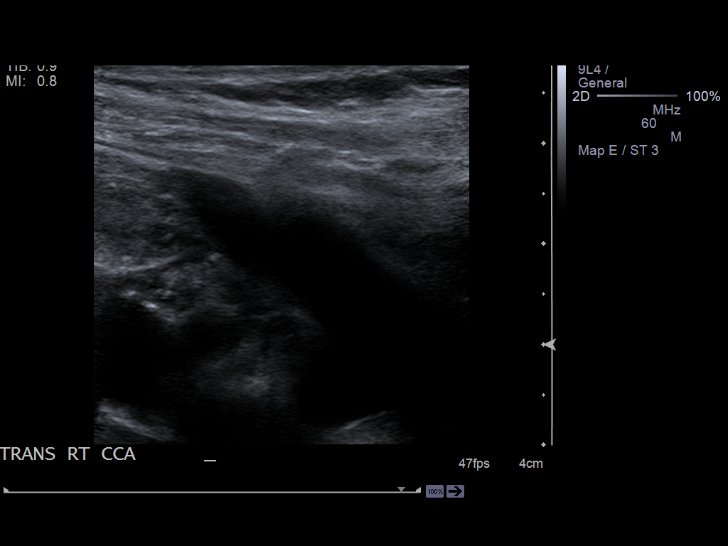
[im 5/56]
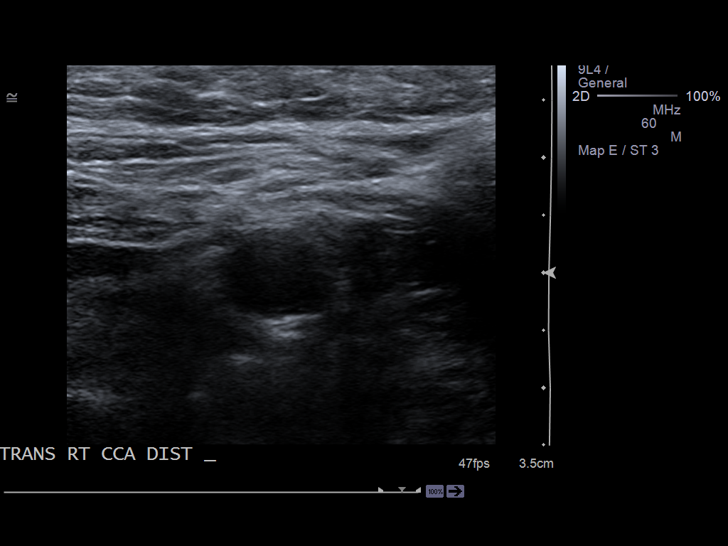
[im 10/56]
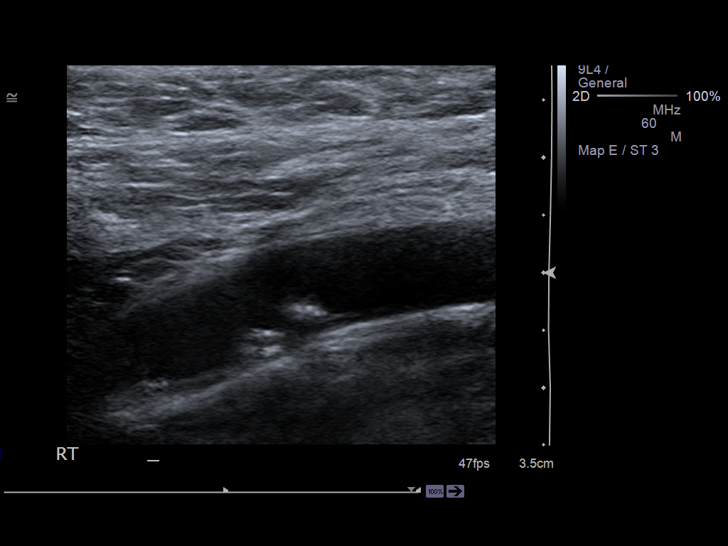
[im 15/56]
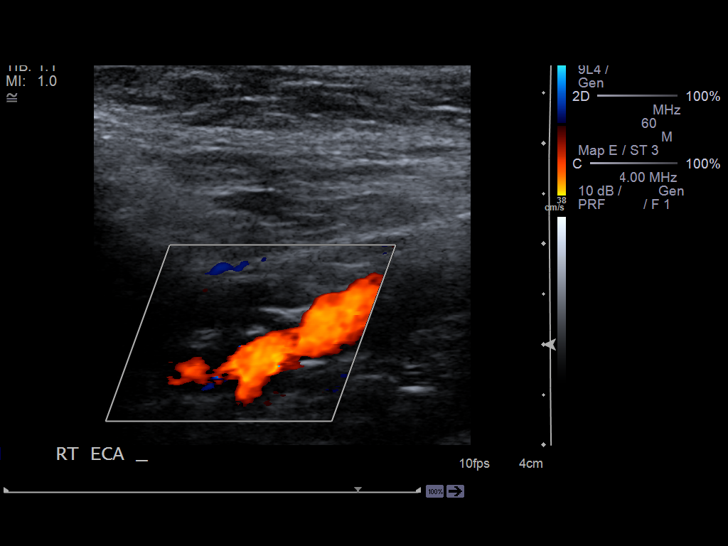
[im 20/56]
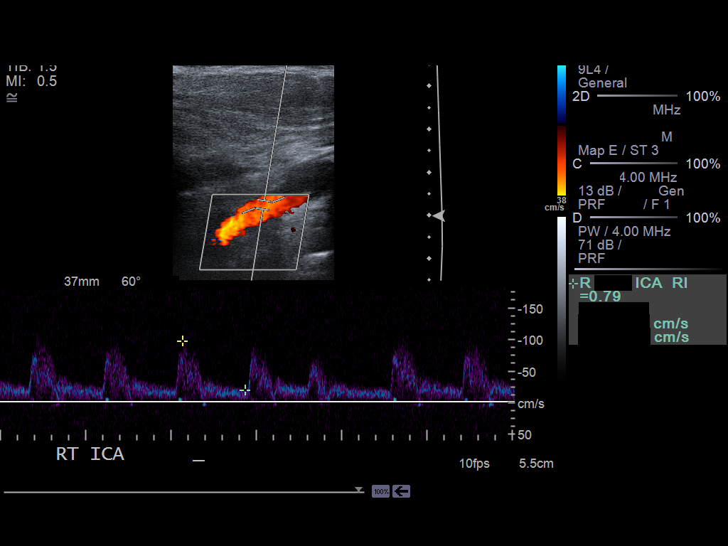
[im 24/56]
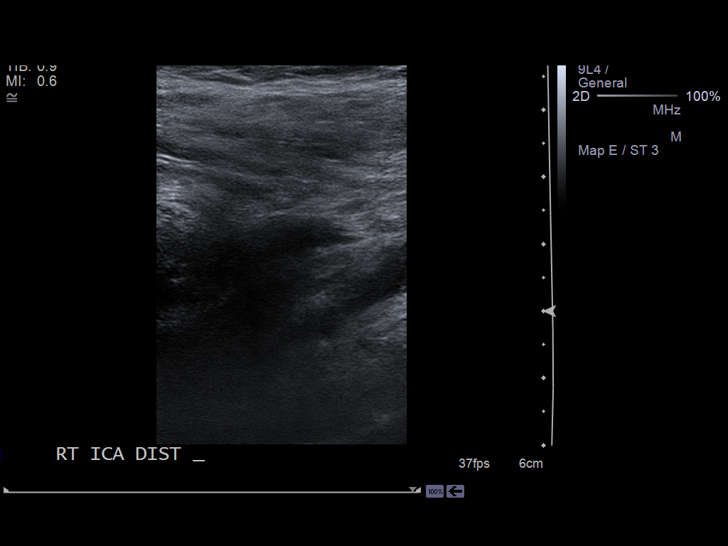
[im 29/56]
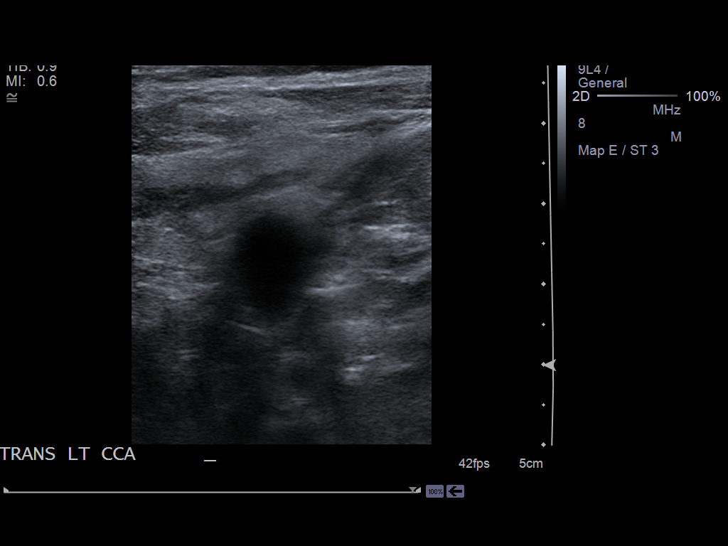
[im 32/56]
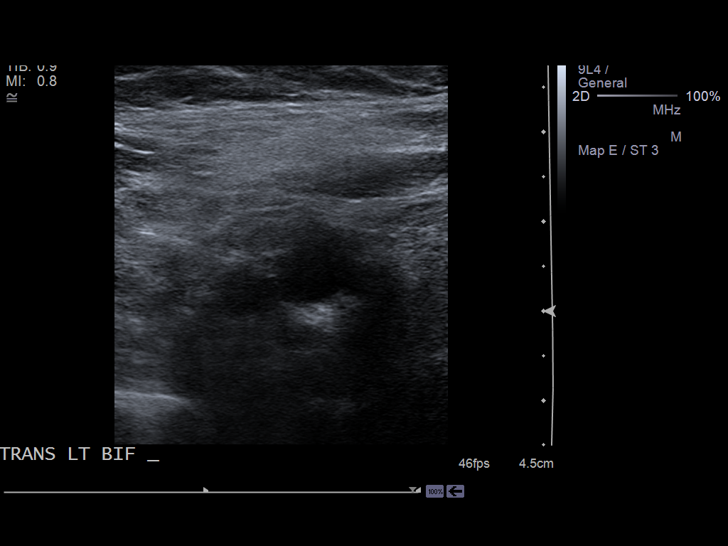
[im 36/56]
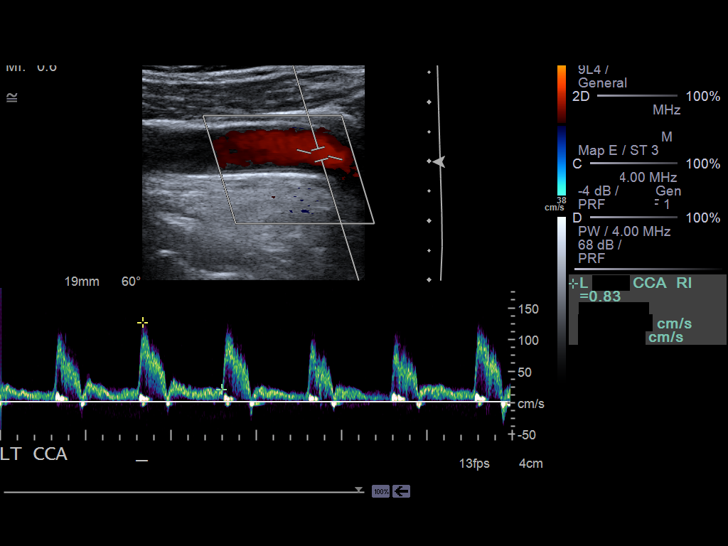
[im 41/56]
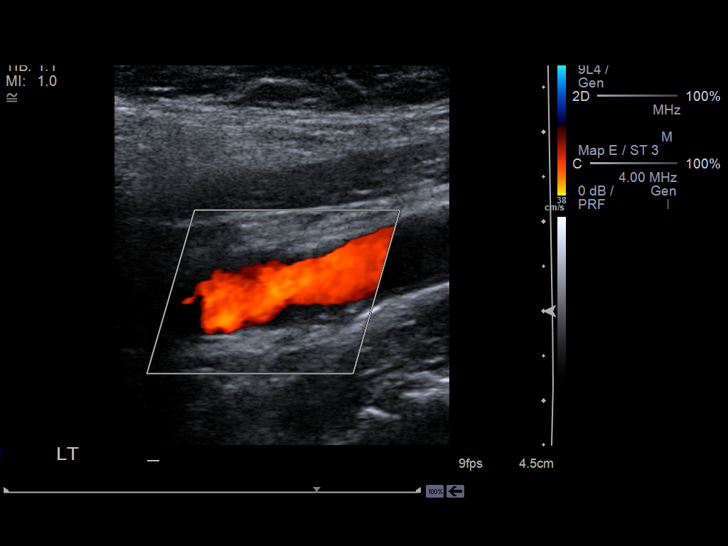
[im 46/56]
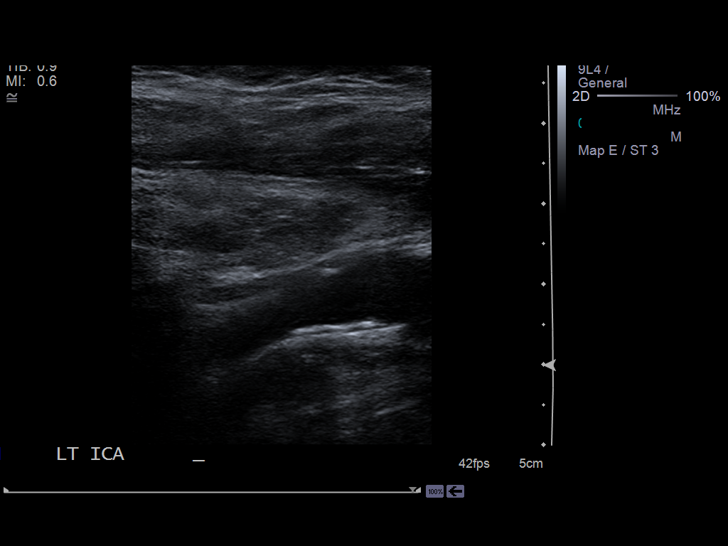
[im 51/56]
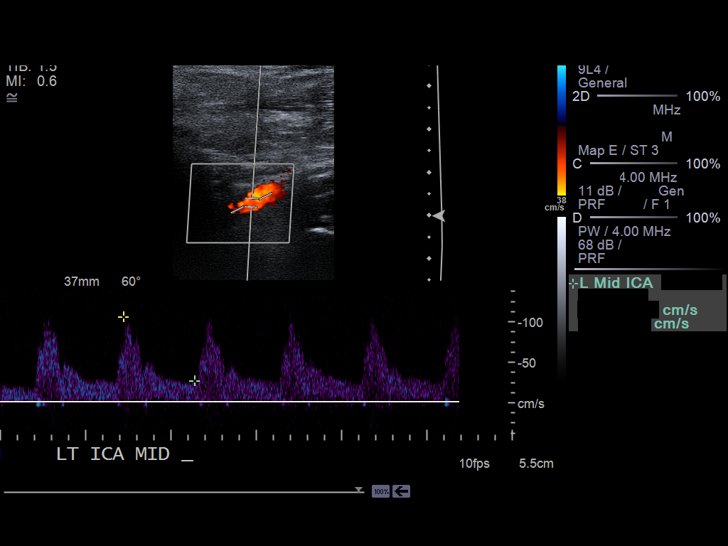
[im 56/56]
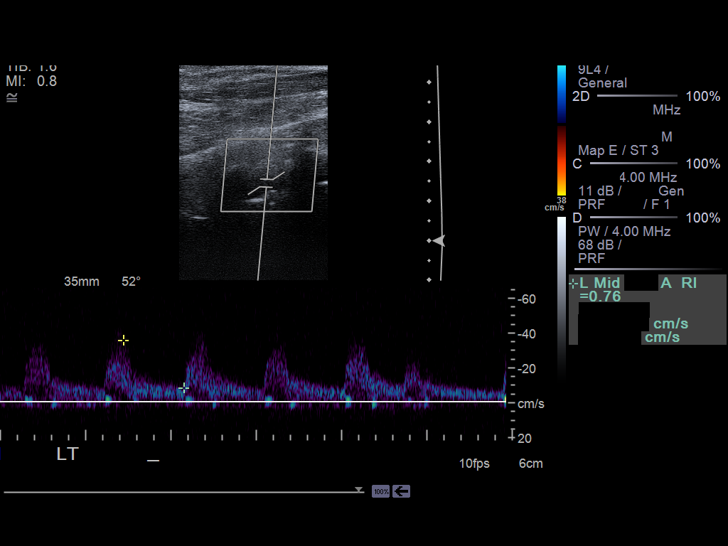

[13 of 24 positions shown; findings below may reference images not displayed]

FINDINGS: Criteria: Quantification of carotid stenosis is based on velocity
parameters that correlate the residual internal carotid diameter
with NASCET-based stenosis levels, using the diameter of the distal
internal carotid lumen as the denominator for stenosis measurement.

The following velocity measurements were obtained:

RIGHT

ICA:  98 cm/sec

CCA:  125 cm/sec

SYSTOLIC ICA/CCA RATIO:

DIASTOLIC ICA/CCA RATIO:

ECA:  187 cm/sec

LEFT

ICA:  106 cm/sec

CCA:  127 cm/sec

SYSTOLIC ICA/CCA RATIO:

DIASTOLIC ICA/CCA RATIO:

ECA:  133 cm/sec

RIGHT CAROTID ARTERY: Mild calcified plaque in the bulb. Low
resistance internal carotid Doppler pattern with sharp upstroke.

RIGHT VERTEBRAL ARTERY:  Antegrade.  Normal Doppler pattern.

LEFT CAROTID ARTERY: Minimal smooth plaque along the bulb wall. Low
resistance internal carotid Doppler pattern.

LEFT VERTEBRAL ARTERY:  Antegrade.  Low resistance Doppler pattern.
IMPRESSION: Less than 50% stenosis in the right and left internal carotid
arteries

## 2015-02-24 NOTE — Consult Note (Signed)
PATIENT NAME:  Gerald [name], Gerald [name] MR#:  [id number] DATE OF BIRTH:  August 07, 1946  DATE OF CONSULTATION:  09/20/2012  REFERRING PHYSICIAN:  Vella Kohler, MD CONSULTING PHYSICIAN:  Demetrios Loll, MD  PRIMARY CARE PHYSICIAN: Lorelee Market, MD  REASON FOR CONSULTATION: Medical management.   HISTORY OF PRESENT ILLNESS: The patient is a 69 year old Caucasian male with a history of hypertension, diabetes, chronic kidney disease, coronary artery disease, and history of prostate cancer who had left ureteral blockage and colovesical fistula and underwent cystoscopy insertion and sigmoid colostomy today. Now the patient is status post surgery. Dr. Vella Kohler requested medical management. The patient is in the bed, complains of abdominal pain, on morphine pump. He has no other complaints. No fever, chills, cough, sputum, or shortness of breath. No chest pain, palpitations, orthopnea, or nocturnal dyspnea. The patient said he has hypertension and diabetes which has been well controlled at home.   PAST MEDICAL HISTORY:  1. Hypertension.  2. Diabetes.  3. Chronic kidney disease. 4. Coronary artery disease.  5. Prostate cancer status post radiation. 6. 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   PAST SURGICAL HISTORY:  1. Coronary artery bypass graft. 2. Today's sigmoid colostomy.   FAMILY HISTORY: Hypertension and CVA on his mother's side.   SOCIAL HISTORY: No smoking, drinking, or illicit drugs.   ALLERGIES: No known drug allergies.    MEDICATIONS:  1. Zytiga 25 mg p.o. four tablets once a day.  2. Vitamin D3 400 international units p.o. capsule one cap a day at bedtime.  3. Vitamin B12 1000 mcg tablets one 1 tablet p.o. daily. 4. Trazodone 150 mg p.o. 1/2 to 1 tablet once a day. 5. Prednisone 5 mg p.o. twice a day. 6. Pravastatin 40 mg p.o. once a day.  7. OxyContin 40 mg p.o. every 12 hours. 8. MiraLax p.o. powder one dose once a day p.r.n. for constipation.  9. Lopressor 50 mg p.o. daily.   10. Lisinopril 20 mg p.o. daily. 11. Integra S one tablet once a day.  12. Glimepiride 2 mg one tablet p.o. daily. 13. Fish oil 1000 mg p.o. daily.  14. Plavix 75 mg p.o. daily.  15. Tylenol one tablet p.o. every four hours p.r.n.  REVIEW OF SYSTEMS: CONSTITUTIONAL: The patient denies any fever or chills. No headache or dizziness. No weakness or weight loss. EYES: No double vision or blurred vision. ENT: No epistaxis, postnasal drip, slurred speech, or dysphagia. CARDIOVASCULAR: No chest pain, palpitations, orthopnea, or nocturnal dyspnea. No leg edema. PULMONARY: No cough, sputum, shortness of breath, or hematemesis. GI: No abdominal pain, nausea, vomiting, or diarrhea. No melena or bloody stool GU: No dysuria or hematuria, but has fecal discharge from urinary tract which is possibly due to fistula. SKIN: No rash or jaundice. NEUROLOGY: No syncope, loss of consciousness, or seizure. ENDOCRINE: No polyuria, polydipsia, heat or cold intolerance.   PHYSICAL EXAMINATION:   VITAL SIGNS: Temperature 97.7, blood pressure 164/77, pulse 83, and oxygen saturation 98% on oxygen, 2 liters.   GENERAL: The patient is alert, awake, and oriented, in no acute distress.   HEENT: Pupils are round, equal, and reactive to light and accommodation. Moist oral mucosa. Clear oropharynx.   NECK: Supple. No JVD or carotid bruit. No lymphadenopathy. No thyromegaly.   CARDIOVASCULAR: S1 and S2 regular rate and rhythm. No murmurs, gallops.   PULMONARY: Bilateral air entry. No wheezing or rales. No use of accessory muscles to breathe.   ABDOMEN: Soft. No distention. Mild tenderness. No organomegaly. Bowel sounds present.  EXTREMITIES: No edema, clubbing, or cyanosis. No calf tenderness. Strong bilateral pedal pulses.   SKIN: No rash or jaundice.   NEUROLOGY: Alert and oriented x3. No focal deficits. Deep tendon reflexes 2+. Power five out of five. Sensation intact.   LABORATORY DATA: There is no laboratory today  or yesterday. The recent labs on 09/12/2012 show glucose 210, BUN 35, and creatinine 2.16. Electrolytes were normal. WBC was 8.3, hemoglobin 9.9, and platelets 244.   IMPRESSION:  1. 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 2. Hypertension.  3. Diabetes.  4. Coronary artery disease.  5. Chronic kidney disease.  6. Anemia.  RECOMMENDATIONS: Hold Plavix. Pain control. IV fluid support. Check CBC and BMP. We will start sliding scale, check Hemoglobin A1c, and hold glimepiride. For hypertension, we will continue Lopressor and lisinopril. We will start GI and DVT prophylaxis.           I discussed the patient's situation and recommendations with the patient and the patient's wife.   TIME SPENT: About 55 minutes.  ____________________________ Demetrios Loll, MD qc:slb D: 09/20/2012 14:56:40 ET T: 09/20/2012 15:43:56 ET JOB#: [id number]  cc: Demetrios Loll, MD, <Dictator> Meindert A. Brunetta Genera, MD Demetrios Loll MD ELECTRONICALLY SIGNED 09/20/2012 22:24

## 2015-02-24 NOTE — Consult Note (Signed)
He has noted reddish drainage around the catheter when walking which is most likely drainage from the fistula.  His urine is clear. Would keep the urinary stream diverted for at least 6 weeks.  Continue Foley catheter drainage for now.  May arrange for suprapubic tube placement within the next one to 2 weeks.  Electronic Signatures: Abbie Sons (MD)  (Signed on 20-Nov-13 12:54)  Authored  Last Updated: 20-Nov-13 12:54 by Abbie Sons (MD)

## 2015-02-24 NOTE — Op Note (Signed)
PATIENT NAME:  Gerald, [name] MR#:  [id number] DATE OF BIRTH:  09/14/1946  DATE OF PROCEDURE:  07/18/2012  PREOPERATIVE DIAGNOSES:  1. Metastatic prostate cancer.  2. Left ureteral obstruction.   POSTOPERATIVE DIAGNOSES:  1. Metastatic prostate cancer.  2. Left ureteral obstruction.  3. Rectourethral fistula.   PROCEDURE: Cystoscopy.   SURGEON: John Giovanni, MD   ASSISTANT: None.   ANESTHESIA: General.   INDICATIONS: The patient is a 69 year old male with metastatic prostate cancer, previously status post brachytherapy and hormonal therapy. He was noted in 2011 to have left hydronephrosis secondary to extrinsic obstruction. He has been treated with a metallic stent and was scheduled for stent change. Since his last office visit,  he states he has not voided per urethra and does note "seepage" through the rectum.   DESCRIPTION OF PROCEDURE: The patient was taken to the Operating Room where general anesthetic was administered. He was placed in the low lithotomy position. He was wearing a diaper, and upon diaper removal he was noted to have clear fluid per rectum. His external genitalia were prepped and draped in the usual fashion. A 21-French  cystoscope was lubricated and passed under direct vision. The penile bulbar urethra were normal in appearance. At the proximal aspect of the bulbar urethra, the scope entered a large expanse which did appear consistent with a rectum. A finger was inserted into the rectum, and the finger was noted right at the end of the urethra. A stent could not be located after close searching. Visualization was suboptimal due to slight oozing. At this point it was elected to abandon the procedure. There was a bleeder noted at the proximal aspect of the urethra which was fulgurated with a Bugbee electrode. Hemostasis was noted to be adequate. The cystoscope was removed. The patient went to the PACU in stable condition.   ESTIMATED BLOOD LOSS: Minimal.    ____________________________ Ronda Fairly. Bernardo Heater, MD scs:cbb D: 07/18/2012 08:26:32 ET T: 07/18/2012 09:35:46 ET JOB#: [id number]  cc: Nicki Reaper C. Bernardo Heater, MD, <Dictator> Abbie Sons MD ELECTRONICALLY SIGNED 07/19/2012 13:18

## 2015-02-24 NOTE — Op Note (Signed)
PATIENT NAME:  Gerald [name], Gerald [name] MR#:  [id number] DATE OF BIRTH:  1946/05/29  DATE OF PROCEDURE:  09/20/2012  PREOPERATIVE DIAGNOSIS: Rectourethral fistula.   POSTOPERATIVE DIAGNOSIS: Rectourethral fistula.   PROCEDURE: Cystoscopy with catheter placement.   SURGEON: John Giovanni, MD  ASSISTANT: None.   ANESTHESIA: General.   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 He has left ureteral obstruction secondary to pelvic recurrence of disease. He has progressive disease and is currently on Lupron, Zytiga, and prednisone. He is followed by Dr. Oliva Bustard. He has developed a rectourethral fistula. He had an attempted stent exchange in September, however, the bladder could not be localized secondary to the fistula. He is scheduled for diverting colostomy with Dr. Vella Kohler. He presents for flexible cystoscopy with attempt at catheter placement.   DESCRIPTION OF PROCEDURE: The patient was taken to the Operating Room where a general anesthetic was administered. His external genitalia were prepped and draped in the usual fashion. Time out was performed. A flexible cystoscope was lubricated and inserted per urethra. In the bulbar urethra, greater than 20 French opening was identified which the scope was negotiated through and was obviously the rectum. The scope was withdrawn and deflected upward. Verumontanum could be identified and a guidewire was passed through this area, through the ureteroscope. The scope was advanced into the bladder. The metallic stent was easily visualized. Additional length of the guidewire was placed in the bladder and the cystoscope was       withdrawn. An 39 French Councill catheter was then placed over the wire with return of clear urine. The patient was then prepped and draped for his diverting colostomy which will be dictated separately by Dr. Vella Kohler. ____________________________ Ronda Fairly. Bernardo Heater,  MD scs:slb D: 09/20/2012 13:08:38 ET T: 09/20/2012 13:27:20 ET JOB#: [id number]  cc: Nicki Reaper C. Bernardo Heater, MD, <Dictator> Abbie Sons MD ELECTRONICALLY SIGNED 09/26/2012 8:47

## 2015-02-24 NOTE — Op Note (Signed)
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  PROCEDURES PERFORMED: Sigmoid colostomy, Hartmann procedure.   SURGEON: Vella Kohler, MD  INDICATIONS: This gentleman who has cancer of the prostate and also has had radiation therapy for 10 years had ureter block for which he had a stent put in about 7 or 8 years ago and the urologist last week tried to enter the bladder and could not enter and after that the patient developed a fistula with the rectum to the bladder. It was passing stool through the penis. The patient is an obese gentleman and it was decided to divert him at this time and then I asked the urologist to please come in and see if he could put a Foley catheter in of the bladder and then in the meantime I can do the [facility] procedure and divert him and also it will give me a chance to feel inside in the pelvis what can I feel.  OPERATIVE FINDINGS/DESCRIPTION OF PROCEDURE: There was a mass in the left side of the pelvic region. It was fixed and the ureter was in. On the right side, it was free. I could not feel any tumor in the right side. The liver was normal. I could not feel any tumor grossly in the liver. Grossly no other tumor was found in the abdomen. Since the patient was really big, it was a difficult dissection to get the colon out through the subcutaneous fat and brought outside because we needed a very long length of the sigmoid colon so I went almost to the splenic flexure to release the colon and then divided the colon in the abdomen with a GI stapler and brought the other end out and took the mesocolon with the Bovie and ties and brought out the colostomy. I brought quite a long portion of colon  out through the colostomy which I made on the left side and took out a lot of fat and subcutaneous tissue so that the colostomy can through it. After a lot of adjustment, I was able to bring the colostomy out through the skin with good length.   After that we irrigated in the abdomen to make sure there was no bleeding and then we closed the abdomen with interrupted 0 Vicryl sutures, staples applied on the skin. The colostomy was then sutured to the fascia and the peritoneum with 2-0 Vicryl sutures all around and then I matured the colostomy with 3-0 Vicryl and brought it to the skin and to the inverted the mucosa. All around the color of the mucosa looked good and it did not look compromised. The patient tolerated the procedure well and was sent to the recovery room in satisfactory condition.  ____________________________ Welford Roche Phylis Bougie, MD msh:slb D: 09/20/2012 12:30:29 ET T: 09/20/2012 12:55:22 ET JOB#: [id number]  cc: Raphaela Cannaday S. Phylis Bougie, MD, <Dictator> Martie Lee. Choksi, MD Meindert A. Brunetta Genera, MD Sharene Butters MD ELECTRONICALLY SIGNED 10/02/2012 11:06

## 2015-02-27 NOTE — Discharge Summary (Signed)
PATIENT NAME:  Gerald [name], Gerald [name] MR#:  [id number] DATE OF BIRTH:  01/29/1946  DATE OF ADMISSION:  09/26/2013 DATE OF DISCHARGE:  09/27/2013  DISCHARGE DIAGNOSES:  1.  Left frontal lobe infarct with acute right hand, right leg weakness as well as expressive aphasia, resolving.  2.  Diabetes mellitus, hemoglobin A1c of 6.2. 3.  Chronic kidney disease.  4.  Leukocytosis, stress-related, resolved.  5.  Relative hypotension, on home medications, now, lisinopril is being suspended.  6.  Hyperlipidemia with LDL 39.  7.  Left internal carotid artery stenosis, suspected less than 50% on Doppler ultrasound.  8.  Hypertension.  9.  Coronary artery disease, status post coronary artery bypass grafting.  10.  Metastatic prostate carcinoma.     DISCHARGE MEDICATIONS: The patient is to continue: Metoprolol tartrate 50 mg p.o. daily.  Vitamin D3 400 units daily.  Glimepiride 2 mg p.o. daily.  Vitamin B12 1000 mcg p.o. daily.  Pravachol 40 mg p.o. at bedtime.  OxyContin 40 mg p.o. every 12 hours.  Trazodone 150 mg p.o. daily.  Plavix 75 mg p.o. daily.  Procrit 1 injectable dose every week on Thursdays, unknown dose.  Acetaminophen hydrocodone 5/500, one tablet every 4 hours as needed.  Fish oil 1 g once daily.  Vitamin C 500 mg once daily.   The patient was advised not to take lisinopril for the next 1 week then according to MD's recommendations. The patient was also advised to follow blood pressure readings closely   HOME OXYGEN: None.   DIET: Two grams salt, low-fat, low-cholesterol, carbohydrate -controlled diet, regular consistency.   ACTIVITY LIMITATIONS: As tolerated.    FOLLOWUP APPOINTMENT: With Dr. Brunetta Genera in 2 days after discharge, Dr. Lucky Cowboy in 1 week after discharge, Dr. Oliva Bustard in 1 week after discharge.   CONSULTANTS: Care management, social work, Dr. Melrose Nakayama.   RADIOLOGIC STUDIES: CT of head without contrast, the 19th of November, 2014, showed no evidence of acute large territory  cortical infarct, new area of hypoattenuation involving the white matter in the posterior left frontal lobe compatible with ischemia of indeterminate acuity.   Repeated CT scan of head without contrast, 20th of November, 2014, revealed a subacute left frontal lobe infarct, similar distribution of prior, although loss of gray-white junction is now more readily apparent. This is compatible with expected evolutionary changes. No intracranial hemorrhage and no mass lesion.   Carotid ultrasound, bilateral, on the 20th of November 2014, showed less than 50% stenosis in the right and left internal carotid arteries.   Echocardiogram, 20th of November, 2014, revealed a left ventricular ejection fraction by visual estimation of 60% to 65%, normal global left ventricular systolic function, mildly dilated left atrium, severely increased left ventricular posterior wall thickness, mild tricuspid regurgitation.   HISTORY OF PRESENT ILLNESS: The patient is a 69 year old Caucasian male with past medical history significant for history of metastatic prostate carcinoma, who presents to the hospital with right-sided weakness. Please refer to Dr. Samantha Crimes admission note on the 19th of November 2014.   On arrival to the hospital, the patient's temperature was 98.5. Pulse was 66. Respiratory rate was 20, blood pressure 135/67. Saturation was 99% on room air. Physical exam revealed some clumsiness of his right hand as well as expressive aphasia. The patient was admitted to the hospital for further evaluation with diagnosis of stroke.   He was continued on Plavix. Work-up for stroke was entertained. The patient had a carotid ultrasound done which was initially reported to have just left ICA  stenosis, but looking into it now, a new reading by radiologist, it appears that the patient has bilateral less than 50% stenosis on the right as well as left internal carotid arteries. Echocardiogram was unremarkable. The patient's telemetry  was found to be normal. Normal sinus rhythm. No changes over there.   In regards to stroke, it was unclear why the patient had this episode of stroke; however, we implicated history of hyperlipidemia as well as a history of diabetes. We recommend to continue Plavix as well as Pravachol. The patient's LDL was found to be 39. His carotid ultrasound showed abnormalities with stenosis less than 50%. That  was discussed with Dr. Lucky Cowboy, who was curbsided; however, we were not able to get CTA of the patient's carotid arteries due to history of chronic renal failure. Dr. Lucky Cowboy recommended to follow up with him and possibly get an outpatient ultrasound repeated.   The patient received occupational therapist as well as physical therapist as well as speech therapist assistance while he was in the hospital.   Since the patient's blood pressure was noted to be somewhat low on his usual blood pressure medications, it was felt that the patient's low blood pressure combined with bilateral, even mild, ICA stenosis, could have contributed to hypoperfusion and stroke formation. We felt that the patient should hold his ACE inhibitor at this time. He is, however, to continue metoprolol and follow up with his primary care physician for further recommendations.   The patient was seen by Dr. Melrose Nakayama while in the hospital; however, the results and recommendations were still pending at the time of dictation.   In regards to the patient's diabetes mellitus, the patient's diabetes was well controlled. His hemoglobin A1c was found to be 6.2. The patient is to continue his usual outpatient management.   For history of CKD, the patient's kidney function remained stable while he was in the hospital, and it was at baseline. On the day of admission, the 19th of November, 2014, the patient's creatinine was 1.90, which would represent estimated GFR for non-African American of 35. It has remained stable in the hospital; however, no interventions  were made, and no further recommendations were made in regards to renal insufficiency.   The patient was noted to have mild leukocytosis on arrival to the hospital. His white blood cell count was elevated to 10.7. The patient's red blood cell count was rechecked on the 21st of November, 2014. It was normal at 6.9. It was felt that the patient's leukocytosis could have been due to stress.   In regards to relative hypotension on home medications, as mentioned above, we made the decision to suspend his lisinopril dose. The patient is to continue metoprolol for blood pressure management. He is to follow up with his primary care physician, Dr. Brunetta Genera, for recommendations of management of his blood pressure.   The patient's lipid panel was checked while he was in the hospital and total cholesterol level was found to be 100 and the patient's LDL was 39.  Triglyceride level was 125 and HDL was 36. The patient was advised to continue his cholesterol medications with no changes.   For ICA stenosis, bilateral, less than 50%, the patient is to follow up with Dr. Lucky Cowboy for further recommendations.   For history of metastatic prostate carcinoma, the patient is to continue follow-up with Dr. Oliva Bustard. Apparently the patient had a recent PET CT scan done by Dr. Oliva Bustard on the 17th of November, 2014, for restaging his prostate carcinoma,  and Dr. Oliva Bustard is to discuss with the patient as well as family those PET scan results.   DISCHARGE CONDITION: Stable condition with the above-mentioned medications and follow-up.   His vital signs on the day of discharge: Temperature was 97.8, pulse was 62, respiratory rate was 18, blood pressure 120/72, saturation was 97% on room air at rest.   TIME SPENT: 40 minutes on this patient.    ____________________________ Theodoro Grist, MD rv:np D: 09/27/2013 19:18:43 ET T: 09/27/2013 21:19:39 ET JOB#: [id number]  cc: Theodoro Grist, MD, <Dictator> Meindert A. Brunetta Genera, MD Algernon Huxley, MD Martie Lee. Oliva Bustard, MD  Theodoro Grist MD ELECTRONICALLY SIGNED 10/15/2013 16:11

## 2015-02-27 NOTE — Op Note (Signed)
PATIENT NAME:  Gerald [name], Gerald [name] MR#:  [id number] DATE OF BIRTH:  July 21, 1946  DATE OF PROCEDURE:  12/19/2012  PREOPERATIVE DIAGNOSES:  1. 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  2. Rectourethral fistula.   POSTOPERATIVE  DIAGNOSES:  1. 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  2. Rectourethral fistula.   PROCEDURES:  1. Exchange of left ureteral stent.  2. Suprapubic tract dilation with suprapubic tube placement.   SURGEON: Scott C. Bernardo Heater, MD   ASSISTANT: None.   ANESTHETIC: General.   INDICATIONS: A 69 year old male with metastatic prostate cancer. He has a rectourethral fistula and recently underwent diverting colostomy. He has a 10 [place] indwelling suprapubic tube; however, the fistula has not healed. He presents for exchange of his left ureteral stent and placement of a larger suprapubic tube.   DESCRIPTION: The patient was taken to the operating room, where general anesthetic was administered. He was placed in the low lithotomy position, and his external genitalia were prepped and draped in the usual fashion. Timeout was performed per protocol. A 21 French cystoscope with 30 degree lens was lubricated and passed under direct vision. The anterior urethra was normal in appearance. Just distal to the prostatic apex, the rectourethral fistula was identified. The bladder could not be located with a rigid scope. The scope was removed, and a flexible cystoscope was inserted. The bladder neck opening was identified. A guidewire was placed through the flexible cystoscope, and the cystoscope was removed. The rigid cystoscope was passed following the wire into the bladder. The previously placed metallic stent was identified exiting the left ureteral orifice. There was no significant encrustation. A 0.035 guidewire was placed into the left ureteral orifice alongside the stent and passed up into the renal pelvis under fluoroscopic guidance. The cystoscope was  removed. The cystoscope was repassed alongside the wire. The metallic stent was grasped and removed under fluoroscopic guidance. The guidewire was then back-loaded on the cystoscope, which was advanced into the bladder. A retrograde pyelogram was performed which showed the metallic marker at the [facility]. A 24 cm metallic Resonance stent was placed. There was good curl seen in the renal pelvis. The distal end of the stent was well positioned in the bladder under direct vision. The 10 French suprapubic tube was visualized with the cystoscope entering the bladder. A guidewire was placed through this suprapubic tube, which was removed. Fascial dilators of 8 and 10 [place] were easily passed. A fascial dilator with peel-away sheath from 12 graduating to 71 [place] was then placed under direct vision. A 16 French Foley was placed in the peel-away sheath and inflated with 10 mL of sterile water, and the sheath was removed. The suprapubic tube was well positioned in the bladder and irrigated without problems. It was secured in place with an additional 2-0 nylon suture. Dressing of plain gauze and tape was applied. The patient was taken to the PACU in stable condition. There were no complications. EBL minimal.    ____________________________ Ronda Fairly. Bernardo Heater, MD scs:OSi D: 12/19/2012 12:57:36 ET T: 12/19/2012 13:19:24 ET JOB#: [id number]  cc: Nicki Reaper C. Bernardo Heater, MD, <Dictator> Abbie Sons MD ELECTRONICALLY SIGNED 12/26/2012 7:58

## 2015-02-27 NOTE — Consult Note (Signed)
Referring Physician:  Lytle Butte   Primary Care Physician:  Lorelee Market : [facility] [place], [place] [address], [place], [place], [place] [address], [place] [phone]  Reason for Consult: Admit Date: 25-Sep-2013  Chief Complaint: Right sided weakness  Reason for Consult: weakness   History of Present Illness: History of Present Illness:   69 year old man with metastatic prostate cancer and a complex medical history presents with right sided weakness.  Symptoms started around 2pm with right arm and leg weakness.  His wife says she also noticed slurred speech around this time though no clear facial droop.  [name] was taken to ED but symptoms had essentially resolved by then.  At Gerald time Gerald symptoms were of moderate severity.  They were constant.  No numbness or tingling, mainly motor deficits.  No clear provoking factors.  No recent medication or activity changes.  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  HISTORY:using alcohol, tobacco or drugs. HISTORY: FH of neurologic disease. MEDICATIONS: hydrocodone 5/500 mg p.o. q.4h. as needed for pain, 75 mg p.o. daily, fish oil 1000 mg p.o. daily, 2 mg p.o. daily, F, B complex with C and folic acid and iron 1 capsule p.o. daily, 20 mg p.o. daily, 50 mg p.o. daily, 40 mg p.o. b.i.d., 40 mg p.o. at bedtime, one dose injectable weekly on Thursdays, 150 mg p.o. at bedtime, B12 1000 mcg tablet p.o. daily, C 500 mg p.o. daily,D3 400 international units p.o. at bedtime.   ALLERGIES: KNOWN DRUG ALLERGIES.    ROS:  General denies complaints   HEENT no complaints   Lungs no complaints   Cardiac no complaints   GI no complaints   GU no complaints   Musculoskeletal no complaints   Extremities no complaints   Skin no complaints   Endocrine no complaints   Psych no complaints   Past  Medical/Surgical Hx:  Chronic kidney disease:   TIA:   Myocardial Infarct:   Cancer prostate and spine:   Ulcer, rectum:   Hydronephrosis:   Anemia:   Diabetes Mellitus, Type II (NIDD):   Hypertension:   Prostrate Cancer:   Multiple cystoscopies and stent exchanges:   CABG (Coronary Artery Bypass Graft):   Ureter stents:   prostate seed implants:   cystoscopy:   cardiac stents:   Home Medications: Medication Instructions Last Modified Date/Time  metoprolol tartrate 50 mg oral tablet 1 tab(s) orally once a day (in Gerald morning) 19-Nov-14 19:56  Vitamin D3 400 intl units oral capsule 1 cap(s) orally once a day (at bedtime) 19-Nov-14 19:56  glimepiride 2 mg oral tablet 1 tab(s) orally once a day (in Gerald morning) 19-Nov-14 19:56  Vitamin B12 1000 mcg oral tablet 1 tab(s) orally once a day (in Gerald morning) 19-Nov-14 19:56  pravastatin 40 mg oral tablet 1 tab(s) orally once a day (at bedtime) 19-Nov-14 19:56  OxyCONTIN 40 mg oral tablet, extended release 1 tab(s) orally every 12 hours 19-Nov-14 19:56  lisinopril 20 mg oral tablet 1 tab(s) orally once a day 19-Nov-14 19:56  trazodone 150 mg oral tablet 1 tab(s) orally once a day (at bedtime) 19-Nov-14 19:56  clopidogrel 75 mg oral tablet 1 tab(s) orally once a day (at bedtime) 19-Nov-14 19:56  Integra F Vitamin B Complex with C, Folic Acid and Iron oral capsule 1 cap(s) orally once a day 19-Nov-14 19:56  Procrit 1 dose(s) injectable once a week on  Thursday, As Needed for low hemoglobin at [facility] 19-Nov-14 19:56  acetaminophen-HYDROcodone 1 tab(s) (5/500 mg) orally every 4 hours, As Needed - for Pain 19-Nov-14 19:56  Fish Oil 1000 mg oral capsule 1 cap(s) orally once a day 19-Nov-14 19:56  Vitamin C 500 mg oral tablet 1 tab(s) orally once a day 19-Nov-14 19:56   Allergies:  No Known Allergies:   Vital Signs: **Vital Signs.:   21-Nov-14 05:55  Vital Signs Type Q 4hr  Temperature Temperature (F) 99.3  Celsius 37.3   Temperature Source oral  Pulse Pulse 67  Respirations Respirations 19  Systolic BP Systolic BP 945  Diastolic BP (mmHg) Diastolic BP (mmHg) 65  Mean BP 79  Pulse Ox % Pulse Ox % 94  Pulse Ox Activity Level  At rest  Oxygen Delivery Room Air/ 21 %   EXAM: GENERAL: Pleasant and in no distress.  Normocephalic and atraumatic.  EYES: Funduscopic exam shows normal disc size, appearance and C/D ratio without clear evidence of papilledema.  CARDIOVASCULAR: S1 and S2 sounds are within normal limits, without murmurs, gallops, or rubs.  MUSCULOSKELETAL: Bulk - Obese Tone - Normal Pronator Drift - Absent bilaterally. Ambulation - Gait and station are within normal limits. Romberg - Absent  R/L 5/5    Shoulder abduction (deltoid/supraspinatus, axillary/suprascapular n, C5) 5/5    Elbow flexion (biceps brachii, musculoskeletal n, C5-6) 5/5    Elbow extension (triceps, radial n, C7) 5/5    Finger adduction (interossei, ulnar n, T1)   5/5    Hip flexion (iliopsoas, L1/L2) 5/5    Knee flexion (hamstrings, sciatic n, L5/S1) 5/5    Knee extension (quadriceps, femoral n, L3/4) 5/5    Ankle dorsiflexion (tibialis anterior, deep fibular n, L4/5) 5/5    Ankle plantarflexion (gastroc, tibial n, S1)  NEUROLOGICAL: MENTAL STATUS: [name] is oriented to person, place and time.  Recent and remote memory are intact.  Attention span and concentration are intact.  Naming, repetition, comprehension and expressive speech are within normal limits.  [name]'s fund of knowledge is within normal limits for educational level.  CRANIAL NERVES: Normal    CN II (normal visual acuity and visual fields) Normal    CN III, IV, VI (extraocular muscles are intact) Normal    CN V (facial sensation is intact bilaterally) Normal    CN VII (facial strength is intact bilaterally) Normal    CN VIII (hearing is intact bilaterally) Normal    CN IX/X (palate elevates midline, normal phonation) Normal    CN XI (shoulder  shrug strength is normal and symmetric) Normal    CN XII (tongue protrudes midline)   SENSATION: Intact to pain and temp bilaterally (spinothalamic tracts) Intact to position and vibration bilaterally (dorsal columns)   REFLEXES: R/L 2+/2+    Biceps 2+/2+    Brachioradialis   2+/2+    Patellar 2+/2+    Achilles   COORDINATION/CEREBELLAR: Finger to nose testing is within normal limits..  Lab Results:  LabObservation:  20-Nov-14 09:12   OBSERVATION Reason for Test  Hepatic:  19-Nov-14 19:35   Bilirubin, Total 0.3  Alkaline Phosphatase 115  SGPT (ALT) 16  SGOT (AST) 28  Total Protein, Serum 6.7  Albumin, Serum  3.2  Cardiology:  19-Nov-14 19:44   Ventricular Rate 66  Atrial Rate 66  P-R Interval 162  QRS Duration 80  QT 400  QTc 419  P Axis 41  R Axis -82  T Axis 73  ECG interpretation Sinus rhythm with Premature  ventricular complexes or Fusion complexes Indeterminate axis Low voltage QRS Cannot rule out Anteroseptal infarct (cited on or before 21-Sep-1995) Abnormal ECG When compared with ECG of 04-Jul-2012 09:55, Fusion complexes are now Present Premature ventricular complexes are now Present Questionable change in QRS axis Serial changes of Anteroseptal infarct Present ----------unconfirmed---------- Confirmed by OVERREAD, NOT ([id number]), editor PEARSON, BARBARA ([id number]) on11/20/2014 8:42:18 AM  20-Nov-14 09:12   Echo Doppler REASON FOR EXAM:     COMMENTS:     PROCEDURE: [place] - ECHO DOPPLER COMPLETE(TRANSTHOR)  - Sep 26 2013  9:12AM   RESULT: Echocardiogram Report  [name] Name:   Gerald [name] Date of Exam: 09/26/2013 Medical Rec #:  [id number]             Custom1: Date of Birth:  06-29-1946           Height:       70.1 in [name] Age:    46 years           Weight:       240.3 lb [name] Gender: M                  BSA:          2.26 m??  Indications: CVA Sonographer:    Sherrie Sport RDCS Referring Phys: Valentino Nose, K  Summary:  1. Left ventricular ejection  fraction, by visual estimation, is 60 to  65%.  2. Normal global left ventricular systolic function.  3. Mildly dilated left atrium.  4. Severely increased left ventricular posterior wall thickness.  5. Mild tricuspid regurgitation. 2D AND M-MODE MEASUREMENTS (normal ranges within parentheses): Left Ventricle:          Normal IVSd (2D):      1.05 cm (0.7-1.1) LVPWd (2D):     1.64 cm (0.7-1.1) Aorta/LA:                  Normal LVIDd (2D):     3.79 cm (3.4-5.7) Aortic Root (2D): 3.30 cm (2.4-3.7) LVIDs (2D):     2.57 cm           Left Atrium (2D): 5.10 cm (1.9-4.0) LV FS (2D):     32.2 %   (>25%) LV EF (2D):     61.2 %   (>50%)                                   Right Ventricle:                                   RVd (2D): LV DIASTOLIC FUNCTION: MV Peak E: 0.70 m/s E/e' Ratio: 12.20 MV Peak A: 1.12 m/s Decel Time: 211 msec E/A Ratio: 0.63 SPECTRAL DOPPLER ANALYSIS (where applicable): Mitral Valve: MV P1/2 Time: 61.33mec MV Area, PHT: 3.60 cm?? Aortic Valve: AoV Max Vel: 1.20 m/s AoV Peak PG: 5.8 mmHg AoV Mean PG: LVOT Vmax: 0.94 m/s LVOT VTI:  LVOT Diameter: 2.00 cm AoV Area, Vmax: 2.46 cm?? AoV Area, VTI:  AoV Area, Vmn: Tricuspid Valve and PA/RV Systolic Pressure: TR Max Velocity: 1.82 m/s RA  Pressure: 5 mmHg RVSP/PASP: 18.2 mmHg  PHYSICIAN INTERPRETATION: Left Ventricle: Gerald left ventricular internal cavity size was normal. LV  septal wall thickness was normal. LV posterior wall thickness was  severely increased. Global LV systolic function was normal. Left  ventricular ejection  fraction, by visual estimation, is 60 to 65%. Right Ventricle: Gerald right ventricular size is mildly enlarged. Global RV  systolic function is normal. Left Atrium: Gerald left atrium is mildly dilated. Right Atrium: Gerald right atrium is normal in size. Pericardium: There is no evidence of pericardial effusion. Mitral Valve: Gerald mitral valve is normal in structure. Trace mitral valve  regurgitation is  seen. Tricuspid Valve: Gerald tricuspid valve is normal. Mild tricuspid  regurgitation is visualized. Gerald tricuspid regurgitant velocity is 1.82  m/s, and with an assumed right atrial pressure of 5 mmHg, Gerald estimated  right ventricular systolic pressure is normal at 18.2 mmHg. Aortic Valve: Gerald aortic valve is normal. Gerald aortic valve is  structurally normal, with no evidence of sclerosis or stenosis. Pulmonic Valve: Gerald pulmonic valve is normal.  [place] MD Electronically signed by Keyes MD Signature Date/Time: 09/26/2013/4:46:37 PM  *** Final ***  IMPRESSION: .  Verified By: Yolonda Kida, M.D., MD  Routine Chem:  19-Nov-14 19:35   Glucose, Serum 83  BUN  35  Creatinine (comp)  1.90  Sodium, Serum 140  Potassium, Serum 4.7  Chloride, Serum 107  CO2, Serum 27  Calcium (Total), Serum 8.6  Osmolality (calc) 287  eGFR (African American)  41  eGFR (Non-African American)  35 (eGFR values <53m/min/1.73 m2 may be an indication of chronic kidney disease (CKD). Calculated eGFR is useful in patients with stable renal function. Gerald eGFR calculation will not be reliable in acutely ill patients when serum creatinine is changing rapidly. It is not useful in  patients on dialysis. Gerald eGFR calculation may not be applicable to patients at Gerald low and high extremes of body sizes, pregnant women, and vegetarians.)  Anion Gap  6  20-Nov-14 05:00   Cholesterol, Serum 100  Triglycerides, Serum 125  HDL (INHOUSE)  36  VLDL Cholesterol Calculated 25  LDL Cholesterol Calculated 39 (Result(s) reported on 26 Sep 2013 at 05:40AM.)  Cardiac:  19-Nov-14 19:35   Troponin I < 0.02 (0.00-0.05 0.05 ng/mL or less: NEGATIVE  Repeat testing in 3-6 hrs  if clinically indicated. >0.05 ng/mL: POTENTIAL  MYOCARDIAL INJURY. Repeat  testing in 3-6 hrs if  clinically indicated. NOTE: An increase or decrease  of 30% or more on serial  testing suggests a  clinically  important change)  Routine UA:  19-Nov-14 23:26   Color (UA) Yellow  Clarity (UA) Hazy  Glucose (UA) Negative  Bilirubin (UA) Negative  Ketones (UA) Negative  Specific Gravity (UA) 1.014  Blood (UA) Negative  pH (UA) 7.0  Protein (UA) 30 mg/dL  Nitrite (UA) Negative  Leukocyte Esterase (UA) 2+ (Result(s) reported on 26 Sep 2013 at 12:01AM.)  RBC (UA) 1 /HPF  WBC (UA) 42 /HPF  Bacteria (UA) 2+  Epithelial Cells (UA) NONE SEEN  WBC Clump (UA) PRESENT (Result(s) reported on 26 Sep 2013 at 12:01AM.)  Routine Hem:  21-Nov-14 05:08   WBC (CBC) 6.9  RBC (CBC)  3.34  Hemoglobin (CBC)  9.1  Hematocrit (CBC)  28.2  Platelet Count (CBC) 157  MCV 85  MCH 27.4  MCHC 32.4  RDW  19.5  Neutrophil % 77.1  Lymphocyte % 10.8  Monocyte % 11.1  Eosinophil % 0.6  Basophil % 0.4  Neutrophil # 5.3  Lymphocyte #  0.7  Monocyte # 0.8  Eosinophil # 0.0  Basophil # 0.0 (Result(s) reported on 27 Sep 2013 at 05:36AM.)   Radiology Results: [place]    20-Nov-14 10:39, [place] Bilateral  US Carotid Doppler Bilateral   REASON FOR EXAM:    cva  COMMENTS:       PROCEDURE: [place]  - US CAROTID DOPPLER BILATERAL  - Sep 26 2013 10:39AM     CLINICAL DATA:  Stroke    EXAM:  BILATERAL CAROTID DUPLEX ULTRASOUND    TECHNIQUE:  Pearline Cables scale imaging, color Doppler and duplex ultrasound were  performed of bilateral carotid and vertebral arteries in Gerald neck.    COMPARISON:  10/02/2011  FINDINGS:  Criteria: Quantification of carotid stenosis is based on velocity  parameters that correlate Gerald residual internal carotid diameter  with NASCET-based stenosis levels, using Gerald diameter of Gerald distal  internal carotid lumen as Gerald denominator for stenosis measurement.    Gerald following velocity measurements were obtained:    RIGHT    ICA:  98 cm/sec    CCA:  580 cm/sec    SYSTOLIC ICA/CCA RATIO:  0.8  DIASTOLIC ICA/CCA RATIO:    ECA:  187 cm/sec    LEFT    ICA:  106 cm/sec    CCA:  127  cm/sec    SYSTOLIC ICA/CCA RATIO:  0.8    DIASTOLIC ICA/CCA RATIO:    ECA:  133 cm/sec  RIGHT CAROTID ARTERY: Mild calcified plaque in Gerald bulb. Low  resistance internal carotid Doppler pattern with sharp upstroke.    RIGHT VERTEBRAL ARTERY:  Antegrade.  Normal Doppler pattern.    LEFT CAROTID ARTERY: Minimal smooth plaque along Gerald bulb wall. Low  resistance internal carotid Doppler pattern.    LEFT VERTEBRAL ARTERY:  Antegrade.  Low resistance Doppler pattern.     IMPRESSION:  Less than 50% stenosis in Gerald right and left internal carotid  arteries    Electronically Signed    By: Maryclare Bean M.D.    On: 09/26/2013 10:58         Verified By: Jamas Lav, M.D.,  CT:    19-Nov-14 19:16, CT Head Without Contrast  CT Head Without Contrast   REASON FOR EXAM:    weakness, slurred speech  COMMENTS:       PROCEDURE: CT  - CT HEAD WITHOUT CONTRAST  - Sep 25 2013  7:16PM     CLINICAL DATA:  Slurred speech and weakness.    EXAM:  CT HEAD WITHOUT CONTRAST    TECHNIQUE:  Contiguous axial images were obtained from Gerald base of Gerald skull  through Gerald vertex without intravenous contrast.    COMPARISON:  10/02/2011  FINDINGS:  There is a small area of new hypoattenuation within Gerald posterior  left frontal lobe which appears to predominantly involve white  matter (images 17-19). There is no mass effect. Hypoattenuation in  Gerald region of Gerald left external capsule is unchanged and compatible  with remote infarct. There is no evidence of acute large territory  cortical infarct. Prominence of Gerald ventricles and sulci is  unchanged and compatible with mildly advanced cerebral atrophy for  age. There is no evidence of intracranial hemorrhage or extra-axial  fluid collection. Gerald visualized paranasal sinuses and mastoid air  cells are clear. Orbits are unremarkable.     IMPRESSION:  1. No definite evidence of acute large territory cortical infarct.  2. New area of hypoattenuation  involving Gerald white matter in Gerald  posterior left frontal lobe, compatible with ischemia of  indeterminate acuity.  These results were called by telephone at Gerald time of interpretation  on 09/25/2013 at 7:45 PM to Dr. Ponciano Ort ,  who verbally  acknowledged these results.      Electronically Signed    By: Logan Bores    On: 09/25/2013 19:49         VerifiedBy: Ferol Luz, M.D.,    20-Nov-14 19:38, CT Head Without Contrast  CT Head Without Contrast   REASON FOR EXAM:    CVA  COMMENTS:       PROCEDURE: CT  - CT HEAD WITHOUT CONTRAST  - Sep 26 2013  7:38PM     CLINICAL DATA:  24 hr followup for acute CVA.    EXAM:  CT HEAD WITHOUT CONTRAST    TECHNIQUE:  Contiguous axial images were obtained from Gerald base of Gerald skull  through Gerald vertex without intravenous contrast.    COMPARISON:  09/25/2013  FINDINGS:  Small suspected remote infarct of Gerald right upper cerebellum.  Brainstem, cerebral peduncles, thalami, and basal ganglia  unremarkable. Basilar cisterns and ventricular system normal.    Along Gerald previously seen hypodensity in Gerald left frontal lobe,  there is now discernible loss of gray-white junction along several  gyri is observed on images 18 through 20 of series 2. No additional  or significant new region of involvement is observed. No hemorrhagic  transformation. No mass lesion.     IMPRESSION:  1. Subacute left frontal lobe infarct, similar distribution as prior  although loss of gray-white junction is now more readily apparent.  This is compatible with expected evolutionary changes. No  intracranial hemorrhage.      Electronically Signed    By: Sherryl Barters M.D.    On: 09/26/2013 21:03         Verified By: Carron Curie, M.D.,   Impression/Recommendations: Recommendations:   69 year old man with metastatic prostate cancer and a complex medical history presents with right sided weakness.   seen to have a left frontal lobe stroke,  appears subacute.  Given abrupt onset of severe symptoms that gradually improved, it is likely that he had a degree of arterial blockage that then dissolved leaving only a smaller amount of blockage intact resulting in Gerald actual stroke seen on HCT.  Gerald evolution of Gerald findings from first CT to Gerald second shows evolution indicating that this stroke was subacute despite Gerald fact that [name] is unable to get a Brain MRI.  Given ischemic stroke, would recommend plavix for secondary stroke prevention.  Carotid ultrasound unremarkable.  TTE negative for intracardiac thrombus. STROKEContinue frequent neuro checks (Q4h) for first 24 hours and reassess frequency after that time.Brain MRI/A confirms ischemic CVA as above.  Carotid doppler - negative for carotid stenosisTTE with bubble study - negative for intracardiac thrombiMonitor on telemetry to eval for afibCheck fasting lipid panel, TSH and HgbA1cCycle cardiac enzymes Cardiac monitoring as described above.Antiplatelet therapy: Plavix 75 mg once a day. TREATMENT:Per ATP III guidlines, given that [name] has additional stroke risk factors, need to be on statin PRESSURE CONTROL:Allow permissive HTN for BP less than 220/110Restart home antihypertensive medications prior to discharge. Fall Precautions - Counseling against smoking - DVT prophylaxis with [place] lovenox.  have reviewed Gerald results of Gerald most recent imaging studies, tests and labs as outlined above and answered all related questions.  I have personally viewed Gerald [name]'s HCT and it shows left frontal ischemic stroke. and coordinated plan of care with hospitalist.    Electronic Signatures: Anabel Bene (MD)  (Signed [phone] 11:27)  Authored: REFERRING PHYSICIAN, Primary Care Physician, Consult, History of Present Illness, Review of  Systems, PAST MEDICAL/SURGICAL HISTORY, HOME MEDICATIONS, ALLERGIES, NURSING VITAL SIGNS, Physical Exam-, LAB RESULTS, RADIOLOGY RESULTS,  Recommendations   Last Updated: 22-Nov-14 11:27 by Anabel Bene (MD)

## 2015-02-27 NOTE — H&P (Signed)
PATIENT NAME:  Gerald [name], Gerald [name] MR#:  [id number] DATE OF BIRTH:  01-Jan-1946  DATE OF ADMISSION:  09/25/2013  REFERRING PHYSICIAN: Dr. Robet Leu   PRIMARY CARE PHYSICIAN: Dr. Brunetta Genera.   CHIEF COMPLAINT: Right-sided weakness.   HISTORY OF PRESENT ILLNESS: This is a 69 year old Caucasian gentleman with past medical history of metastatic prostate cancer, history of transient ischemic attacks, hypertension, coronary artery disease status post CABG, who is evaluated for right-sided weakness. The patient noted acutely at 2:00 p.m. having acute onset of right-sided weakness involving his hand and foot, progressed to involving the majority of the right side of the body as well as having slurred speech noted by his wife. After a few hours, he decided to present to [facility] for further work-up and evaluation. All symptoms at time of presentation have resolved. He  denied any recent fevers, chills, or headache.   REVIEW OF SYSTEMS:  CONSTITUTIONAL: Denies fevers, fatigue, weakness.  EYES: Denies blurred vision, double vision, eye pain.  EARS, NOSE, THROAT: Denies tinnitus, ear pain or hearing loss.  RESPIRATORY: Denies cough, wheeze, shortness of breath.  CARDIOVASCULAR: Denies chest pain, palpitations, edema.  GASTROINTESTINAL: Denies nausea, vomiting, diarrhea, abdominal pain.  GENITOURINARY: Denies dysuria, hematuria.  ENDOCRINE: Denies nocturia or thyroid problems.  HEMATOLOGY AND LYMPHATIC: Denies easy bruising or bleeding.  SKIN: Denies rash or lesion.  MUSCULOSKELETAL: Denies pain in neck, back, shoulders, knees or hips, any arthritic symptoms.  NEUROLOGIC: Denies current paralysis or paresthesias.  PSYCHIATRIC: Denies any anxiety or depressive symptoms.  Otherwise, full review of systems performed by me is negative.   PAST MEDICAL HISTORY: Chronic kidney disease, type 2 diabetes non-insulin-dependent, prostate cancer with metastases, seen by Dr. Oliva Bustard, hypertension, history of  coronary artery disease status post coronary artery bypass graft.   SOCIAL HISTORY: Denies any alcohol, tobacco or drug usage.   FAMILY HISTORY: Denies any known cardiovascular or cerebrovascular accidents.   ALLERGIES: NO KNOWN DRUG ALLERGIES.   HOME MEDICATIONS: Acetaminophen hydrocodone 5/500 mg p.o. q.4h. as needed for pain, Plavix 75 mg p.o. daily, fish oil 1000 mg p.o. daily, Glimepiride 2 mg p.o. daily, Integra F, vitamin B complex with C and folic acid and iron 1 capsule p.o. daily, lisinopril 20 mg p.o. daily, metoprolol 50 mg p.o. daily, OxyContin 40 mg p.o. b.i.d., pravastatin 40 mg p.o. at bedtime, Procrit one dose injectable weekly on Thursdays, trazodone 150 mg p.o. at bedtime, vitamin B12 1000 mcg tablet p.o. daily, vitamin C 500 mg p.o. daily, vitamin D3 400 international units p.o. at bedtime.   PHYSICAL EXAMINATION: VITAL SIGNS: Temperature 98.5, heart rate 66, respirations 20, blood pressure 135/67, saturating 99% on room air. Weight 108.9 kg, BMI 34.4.  GENERAL: Well-nourished, well-developed, Caucasian gentleman who is in no acute distress.  HEAD: Normocephalic, atraumatic.  EYES: Pupils equal, round, reactive to light. Extraocular muscles intact. No scleral icterus.  MOUTH: Mucous membranes. Dentition intact. No abscess noted.  EARS, NOSE, AND THROAT: Throat clear without exudates. No external lesions.  NECK: Supple. No thyromegaly. No nodules. No JVD.  PULMONARY: Clear to auscultation bilaterally. No wheezes, rales, or rhonchi. No use of  accessory muscles. Good respiratory effort.  CHEST: Nontender to palpation.  CARDIOVASCULAR: S1, S2, regular rate and murmur, gallop. No edema. Pedal pulses 2+.  GASTROINTESTINAL: Soft, nontender, nondistended, positive bowel sounds. No hepatosplenomegaly. Ostomy is in place.  MUSCULOSKELETAL: No swelling, clubbing or edema. Range of motion full in all extremities.  NEUROLOGIC: Cranial nerves II through XII intact. Pronator drift within  normal limits.  Strength 5/5 in all extremities, including flexion and extension in proximal and distal groups. Sensation intact. Reflexes intact.  SKIN: No ulcerations, lesions, rashes, cyanosis. Skin warm, dry, turgor intact.  PSYCHIATRIC: Mood and affect within normal limits. Awake, alert, oriented x3. Insight and judgment intact.   LABORATORY DATA: EKG revealing sinus rhythm, occasional PVCs heart rate 66. Sodium 140, potassium 4.7, chloride 107, bicarbonate 27, BUN 35, creatinine 1.90, glucose 83. LFTs within normal limits aside from albumin of 3.2. Troponin I less than 0.02. WBC 10.7, hemoglobin 10.4, platelets of 182. CT head revealing area of hypoattenuation on the posterior left frontal lobe.   ASSESSMENT AND PLAN: A 69 year old Caucasian gentleman with history of metastatic prostate cancer as well as transient ischemic attacks and coronary artery disease, presenting with right-sided weakness. 1.  Cerebrovascular accident. CT for head revealed area of hypotension in the left frontal region. Neuro checks hours. Check lipids. He is already on Plavix and statins. Says he is unable to receive MRI secondary to prostate implants. Will need CT head 24 hours after the first. Will allow permissive hypertension. Blood pressure 220/120 unless becomes symptomatic. At that point would treat with hydralazine 10 mg IV p.r.n.  2.  Type 2 diabetes. Hold oral agents. Add insulin sliding scale.  3.  Hypertension. Continue lisinopril and Lopressor.  4.  Anemia. No indication for transfusion at this time.  5.  Deep venous prophylaxis with sequential compression devices.  CODE STATUS: The patient is DO NOT RESUSCITATE, confirmed by patient at bedside.   TIME SPENT: 45 minutes.    ____________________________ Aaron Mose. Addis Tuohy, MD dkh:cg D: 09/25/2013 22:18:55 ET T: 09/25/2013 22:38:25 ET JOB#: [id number]  cc: Aaron Mose. Lekeshia Kram, MD, <Dictator> Leshawn Houseworth Woodfin Ganja MD ELECTRONICALLY SIGNED 09/26/2013 3:06

## 2015-02-28 NOTE — H&P (Signed)
PATIENT NAME:  Gerald [name], Gerald [name] MR#:  [id number] DATE OF BIRTH:  12-Jun-1946  DATE OF ADMISSION:  01/19/2014  PRIMARY DOCTOR:  Meindert A. Brunetta Genera, MD  ER PHYSICIAN:  Latina Craver, MD  CHIEF COMPLAINT: Lethargy. The patient is a 69 year old male patient with history of prostate cancer, brought in by EMS because of nausea, vomiting since last night with lethargy.    HISTORY OF PRESENT ILLNESS:  A 69 year old male patient with prostate cancer, mets to bone, recently started on new radiation therapy since January, came in because of nausea, vomiting and lethargy. Started to have nausea, vomiting since last night around 1:00 and then continued to have multiple episodes of vomiting till 5:00 this morning also noted to have more lethargy since last night. The patient had been not eating well for about 2 to 3 weeks. According by the wife, patient took all the medication yesterday and did not eat much. Had vomiting last night and lethargy and did not take any morning medications, came in here. He had UA that showed UTI and also noted to have atrial fibrillation with heart rate up to 130 beats per minute. The patient was seen at bedside, says that his back is hurting and also complains of generalized body pains. Denies nausea or abdominal pain at this time. The patient did not have any fever at home. No cough. No trouble breathing. According to the wife, he is not ambulating because of ulcer on his left heel. The patient is going downhill for about 2 months since he started on new radiation therapy for prostate cancer.   PAST MEDICAL HISTORY:  Significant for:  1.  Coronary artery disease, status post bypass and stent placement.  2.  History of prostate cancer, mets to bone.  3.  History of CKD stage 3.  4.  History of TIA.  5.  History of diabetes mellitus type 2.  6.  Chronic back pain secondary to prostate cancer.  7.  History of colovesical fistula.   PAST SURGICAL HISTORY: Significant for history  of coronary bypass surgery and stent placement, and history of colovesical fistula repair with colostomy and also suprapubic catheter placement.   SOCIAL HISTORY: No smoking. No drinking. Lives with wife.   FAMILY HISTORY: No hypertension or diabetes.   MEDICATIONS:  1.  Percocet 1 tablet 5/500 every 4 hours as needed for pain.  2.  Plavix 75 mg p.o. daily.  3.  Fish oil 1 gram p.o. daily.  4.  Amaryl 2 mg p.o. daily.  5.  Vitamin B complex with C and folic acid 1 tablet daily.  6.  Metoprolol tartrate 50 mg p.o. daily.  7.  OxyContin 40 mg p.o. every 12 hours.  8.  Pravastatin 40 mg p.o. daily.  9.  Trazodone 150 mg at bedtime.  10.  Procrit injections once a week   REVIEW OF SYSTEMS:    CONSTITUTIONAL: Feels fatigued and weak.  EYES: No blurred vision.  ENT: No tinnitus. No epistaxis. No difficulty swallowing.  RESPIRATORY: No cough. No wheezing.  CARDIOVASCULAR: No chest pain. No orthopnea. No PND.  GASTROINTESTINAL: The patient has nausea, vomiting since last night. No abdominal pain. No diarrhea.  GENITOURINARY: No dysuria. The patient had SPC placed, gets changed every 6 weeks. Last time it was changed was 2 weeks ago.  GENITOURINARY: The patient has a history of prostate cancer, now getting radiation therapy.  HEMATOLOGIC: He has anemia, gets Procrit shots. Denies any bruising or bleeding now.  INTEGUMENTARY: Does  have a small ulcer on the left heel.  NEUROLOGIC: No numbness or weakness. No dysarthria.    MUSCULOSKELETAL: Has back pain and not much ambulating at home.  PSYCHIATRIC: No anxiety or insomnia.   PHYSICAL EXAMINATION: VITAL SIGNS: Temperature 97.7, heart rate 135, blood pressure 143/77, sats 97% on room air.  GENERAL: The patient is slightly lethargic but arousable, is answering questions appropriately. Complaints of thirstiness and also generalized body pains.  HEAD: Normocephalic, atraumatic.  EYES: Pupils equally reacting to light. Extraocular movements are  intact.  NOSE: No nasal lesions. No drainage.  EARS: No drainage. No external lesions.  MOUTH: No lesions. No exudates but clinically dry mucosa.  NECK: Supple. No JVD. No carotid bruit. Thyroid in the midline.  RESPIRATORY: Good respiratory effort. Clear to auscultation. No wheeze. No rales.  GASTROINTESTINAL: Abdomen is nontender, nondistended. Bowel sounds present. Colostomy bag in the left lower quadrant. No signs of infection around the colostomy site. Also has suprapubic catheter in suprapubic area, no evidence of infection, no redness of the skin.  MUSCULOSKELETAL: Able to move all extremities x 4.  No tenderness or effusion.  SKIN:  Decreased skin turgor.   LYMPHATICS: No lymphadenopathy.  NEUROLOGIC: Cranial nerves II through XII intact. Power 5/5 in upper and lower extremities. Sensation intact. DTRs 2+ bilaterally. The patient, as I mentioned, is alert, awake, oriented. No focal deficit.  VASCULAR: Good peripheral pulses.  PSYCHIATRIC: Mood and affect are within normal limits.   LABORATORY, DIAGNOSTIC AND RADIOLOGICAL DATA:   1.  Electrolytes: Sodium 135, potassium is 5, chloride 103, bicarb 28, BUN 24, creatinine 1.71, glucose 249. LFTs:  Alk phos 362 and the patient's bilirubin is only 0.4.  2.  Troponin 0.8.  3.  WBC 4.3, hemoglobin 8.8, hematocrit 28, platelets 157.  4.  CT head showed no acute intracranial findings, remote findings of bilateral basal ganglia infarcts.  5.  Chest x-ray showed cardiomegaly with some scarring. No acute cardiopulmonary disease.  6.  UA is showing WBC 40, leukocyte esterase 2+ and bacteria 1+, hazy-colored urine.  7.  EKG showed atrial fibrillation with RVR, 120 beats per minute.   ASSESSMENT AND PLAN:  1.  The patient is a 69 year old male patient with lethargy secondary to urinary tract infection.  Admit him to the hospitalist service, start him on Rocephin and obtain urine cultures and titrate antibiotics accordingly.  2.  Dehydration with  chronic renal failure with nausea, vomiting. The patient has already received 2 liters of fluid ,start  on NS 100cc/hr 3.  Lethargy  with metabolic encephalo pathy due to urinary tract infection. CT head unremarkable. The patient is able to answer questions appropriately so monitor him closely.  4.  New-onset atrial fibrillation with rapid ventricular response, likely secondary to dehydration and urinary tract infection. The patient is on metoprolol.  We are going to continue metoprolol IV 2.5 mg IV q.6 p.r.n. as needed for heart rate more than 100 and hold anticoagulation at this time. See if atrial fibrillation persists, we can get cardiology consult and echocardiogram.  5.  Coronary artery disease with history of stent and bypass. The patient is on beta blockers, statins and Plavix. Resume the Plavix and statins. The patient also is on fish oil. 6.  Diabetes mellitus type 2. The patient's p.o. intake it is not good with nausea, vomiting since yesterday so hold Amaryl and continue sliding scale with coverage.  7.  Chronic anemia. The patient gets Procrit shots every week and the patient's hemoglobin dropped  from 9.4 to 8.8. The patient has no evidence of gross bleeding. He can get Procrit shot.  8.  Lethargy. Will hold the trazodone and oxycodone and resume them once his mental status is more alert.  9.  Chronic kidney disease, stage 3. His creatinine is better than before, it is 1.7, it was 2.26 on February 18th.    TIME SPENT ON THIS HISTORY AND PHYSICAL:  60 minutes.       ____________________________ Epifanio Lesches, MD sk:cs D: 01/19/2014 12:39:03 ET T: 01/19/2014 14:30:56 ET JOB#: [id number]  cc: Epifanio Lesches, MD, <Dictator> Meindert A. Brunetta Genera, MD Epifanio Lesches MD ELECTRONICALLY SIGNED 01/20/2014 12:30

## 2015-02-28 NOTE — Discharge Summary (Signed)
PATIENT NAME:  Gerald [name], Gerald [name] MR#:  [id number] DATE OF BIRTH:  04/18/1946  DATE OF ADMISSION:  01/19/2014 DATE OF DISCHARGE:  01/27/2014  PRIMARY CARE PHYSICIAN: Lorelee Market, MD  DISCHARGE DIAGNOSES: 1.  Urinary tract infection. 2.  New-onset atrial fibrillation with rapid ventricular response.  3.  Acute cerebrovascular accident.  4.  Chronic systolic congestive heart failure with ejection fraction 25% to 30%.  5.  Coronary artery disease.  6.  Diabetes.  7.  Anemia.  8.  Prostate cancer with metastasis.   CONDITION: Stable.   CODE STATUS: FULL.  HOME MEDICATIONS: Please refer to the medication reconciliation list.   DIET: Low-sodium, low-fat, low-cholesterol, ADA.  ACTIVITY: As tolerated.   FOLLOW-UP CARE: Follow up with PCP within 1 to 2 weeks. Follow up with Dr. Nehemiah Massed within 1 to 2 weeks.   REASON FOR ADMISSION: Nausea, vomiting, lethargy.  HOSPITAL COURSE: The patient is a 69 year old Caucasian male with a history of prostate cancer with metastasis to bone who was sent to the ED due to nausea, vomiting and lethargy. The patient was noticed to have a UTI and also has A-fib with RVR at 130. In the ED, the patient was treated with antibiotics and admitted to the hospital. For detailed history and physical examination, please refer to the admission note dictated by Dr. Vianne Bulls. On admission date, laboratory data are as follows: BUN 24, creatinine 1.71, glucose 249. WBC 4.3, hemoglobin 8.8. Troponin 0.8. CAT scan of head did not show any acute findings. Chest x-ray showed cardiomegaly with some scarring. Urinalysis showed WBC 40. EKG showed A-fib with RVR at 120.  1.  Urinary tract infection. After admission the patient was treated with Rocephin and then changed to Augmentin. Antibiotics treatment is complete. 2.  Dehydration with acute renal failure. After admission the patient was treated with IV fluid support. Renal function has been improving. BUN decreased to 24.  Creatinine decreased to 1.56. 3.  New-onset A-fib with RVR, which was possibly triggered by UTI. After admission the patient has been treated with Lopressor. We requested a cardiology consult. Dr. Saralyn Pilar suggested starting Cardizem. He adjusted dose to Cardizem 240 mg daily. The patient's heart rate has been under control. Dr. Saralyn Pilar also suggested starting Eliquis for anticoagulation. The patient's heart rate is controlled with Lopressor and Cardizem now.  4.  Acute CVA. During the hospitalization, the patient complained of left facial numbness. We repeated CAT scan of head which showed acute CVA, so we requested a neurology consult and with physical therapy. Dr. Tamala Julian suggested since the patient has already been on Eliquis but still developed acute CVA, he suggested add aspirin.  5.  Chronic systolic CHF, ejection fraction 25% to 30%, stable.  6.  CAD. The patient is on Lopressor, statin, and lisinopril, also Eliquis and aspirin.  7.  Diabetes. Has been treated with sliding scale, stable.   The patient has no complaints. Vital signs are stable. According to physical therapy evaluation, the patient's need to be placed in a nursing facility. The patient is clinically stable and will be discharged to a skilled nursing facility today. I discussed the patient's discharge plan with the patient, the patient's wife, nurse, case manager, and Dr. Oliva Bustard.   TIME SPENT: About 45 minutes.   ____________________________ Demetrios Loll, MD qc:sb D: 01/27/2014 13:04:34 ET T: 01/27/2014 13:36:36 ET JOB#: [id number]  cc: Demetrios Loll, MD, <Dictator> Demetrios Loll MD ELECTRONICALLY SIGNED 01/27/2014 17:32

## 2015-02-28 NOTE — Consult Note (Signed)
Referring Physician:  Epifanio Lesches :   Primary Care Physician:   Epifanio Lesches : [facility] [place], [facility], [address], [place], [place] [address]  Reason for Consult: Admit Date: 21-Jan-2014  Chief Complaint: lethargy  Reason for Consult: CVA   History of Present Illness: History of Present Illness:   69 yo RHD M with hx of cancer presents to [facility] due to lethargy.  He was improving after his hospital course and was noted to have L facial numbness so a CT of the head was performed.  Due to CT of head being abnormal, Neurology was called.  Pt does not complain of any weakness, numbness/tingling or vision changes.  Pt denies a headache as well.  ROS:  General fatigue   HEENT no complaints   Lungs no complaints   Cardiac no complaints   GI no complaints   GU no complaints   Musculoskeletal no complaints   Extremities no complaints   Skin no complaints   Neuro numbness/tingling   Endocrine no complaints   Psych no complaints   Past Medical/Surgical Hx:  Chronic kidney disease:   TIA:   Myocardial Infarct:   Cancer prostate and spine:   Ulcer, rectum:   Hydronephrosis:   Anemia:   Diabetes Mellitus, Type II (NIDD):   Hypertension:   Prostrate Cancer:   Multiple cystoscopies and stent exchanges:   CABG (Coronary Artery Bypass Graft):   Ureter stents:   prostate seed implants:   cystoscopy:   cardiac stents:   Past Medical/ Surgical Hx:  Past Medical History as above   Past Surgical History as above   Home Medications: Medication Instructions Last Modified Date/Time  metoprolol tartrate 50 mg oral tablet 1 tab(s) orally once a day (in the morning) 15-Mar-15 11:31  Vitamin D3 400 intl units oral capsule 1 cap(s) orally once a day (at bedtime) 15-Mar-15 11:31  glimepiride 2 mg oral tablet 1 tab(s) orally once a day (in the morning) 15-Mar-15 11:31  pravastatin 40 mg oral tablet 1 tab(s) orally once  a day (at bedtime) 15-Mar-15 11:31  trazodone 150 mg oral tablet 1 tab(s) orally once a day (at bedtime) 15-Mar-15 11:31  clopidogrel 75 mg oral tablet 1 tab(s) orally once a day (at bedtime) 15-Mar-15 11:31  Procrit 1 dose(s) injectable once a week on Thursday, As Needed for low hemoglobin at the cancer center 15-Mar-15 11:31  Fish Oil 1000 mg oral capsule 1 cap(s) orally once a day 15-Mar-15 11:31  Vitamin C 500 mg oral tablet 1 tab(s) orally once a day 15-Mar-15 11:31  OxyCONTIN 40 mg oral tablet, extended release 1 tab(s) orally every 12 hours 15-Mar-15 11:31  Integra F Vitamin B Complex with C, Folic Acid and Iron oral capsule 1 cap(s) orally once a day 15-Mar-15 11:31  acetaminophen-HYDROcodone 1 tab(s) (5/500 mg) orally every 4 hours, As Needed - for Pain 15-Mar-15 11:31  Vitamin B-12 1000 mcg oral tablet 1 tab(s) orally once a day 15-Mar-15 11:31  multivitamin 1 tab(s) orally once a day 15-Mar-15 11:31   Allergies:  No Known Allergies:   Allergies:  Allergies NKDA   Social/Family History: Employment Status: retired  Lives With: spouse  Living Arrangements: house  Social History: no tob, no EtOH, no illicits  Family History: no seizures, no strokes   Vital Signs: **Vital Signs.:   20-Mar-15 20:47  Vital Signs Type Routine  Temperature Temperature (F) 98.2  Celsius 36.7  Temperature Source oral  Pulse Pulse 81  Respirations Respirations 19  Systolic BP Systolic BP 287  Diastolic BP (mmHg) Diastolic BP (mmHg) 65  Mean BP 78  Pulse Ox % Pulse Ox % 96  Pulse Ox Activity Level  At rest  Oxygen Delivery Room Air/ 21 %   Physical Exam: General: overweight, NAD  HEENT: normocephalic, sclera nonicteric, oropharynx clear except fever blister on L lip  Neck: supple, no JVD, no bruits  Chest: CTA B, no wheezing  Cardiac: RRR, no murmurs, no edema, 2+ pulses  Extremities: no C/C/E, FROM   Neurologic Exam: Mental Status: alert and oriented x 3, normal language, follows  complex commands, mild dysarthria  Cranial Nerves: PERRLA, EOMI, nl VF, mild L droop, tongue midline, shoulder shrug equal  Motor Exam: drift in L UE otherwise 5 /5, normal tone  Deep Tendon Reflexes: 1+/4 B, downgoing plantars  Sensory Exam: decreased temp on L hemibody, nl vibration  Coordination: FTN and HTS WNL   Lab Results:  LabObservation:  16-Mar-15 13:24   OBSERVATION Reason for Test  Hepatic:  15-Mar-15 10:10   Bilirubin, Total 0.4  Alkaline Phosphatase  362 (45-117 NOTE: New Reference Range 09/27/13)  SGPT (ALT)  10  SGOT (AST) 34  Total Protein, Serum 7.1  Albumin, Serum  2.4  Routine Micro:  15-Mar-15 10:26   Organism Name ALCALIGENES FAECALIS SSP FAECALIS  Organism Quantity >100,000 CFU/ML  Ceftriaxone Sensitivity S  Ciprofloxacin Sensitivity S  Gentamicin Sensitivity S  Imipenem Sensitivity S  Levofloxacin Sensitivity S  Trimethoprim/Sulfamethoxazole Sensitivty R  Nitrofurantoin Sensitivity R  Cefazolin Sensitivity R  Ampicillin Sensitivity R  Tigecycline Sensitivity R  Micro Text Report URINE CULTURE   ORGANISM 1                >100,000 CFU/ML PROTEUS VULGARIS GROUP/PROTEUS PEN   ORGANISM 2                >100,000 CFU/ML ALCALIGENES FAECALIS SSP FAECALIS   COMMENT                   -   ANTIBIOTIC                    ORG#1    ORG#2     AMPICILLIN                    R                  CEFAZOLIN                     R                  CEFOXITIN                     S                  CEFTRIAXONE                   S        S         CIPROFLOXACIN                 S        S     ERTAPENEM                     S  GENTAMICIN                    S        S         IMIPENEM                      S        S         LEVOFLOXACIN                  S        S         NITROFURANTOIN                R    TIGECYCLINE                   R                  TRIMETHOPRIM/SULFAMETHOXAZOLE S        R  Specimen Source INDWELLING CATHETER  Organism 1 >100,000  CFU/ML PROTEUS VULGARIS GROUP/PROTEUS PEN  Organism 2 >100,000 CFU/ML ALCALIGENES FAECALIS SSP FAECALIS  Culture Comment - (Result(s) reported on 22 Jan 2014 at 11:31AM.)  Routine Chem:  17-Mar-15 04:48   Iron Binding Capacity (TIBC)  191  Unbound Iron Binding Capacity 155  Iron, Serum  36  Iron Saturation 19 (Result(s) reported on 21 Jan 2014 at 06:50AM.)  Ferritin ([facility])  662 (Result(s) reported on 21 Jan 2014 at 05:51AM.)  19-Mar-15 09:59   Glucose, Serum  166  BUN  24  Creatinine (comp)  1.79  Sodium, Serum  134  Potassium, Serum 4.6  Chloride, Serum 103  CO2, Serum 27  Calcium (Total), Serum 8.6  Anion Gap  4  Osmolality (calc) 276  eGFR (African American)  44  eGFR (Non-African American)  38 (eGFR values <59m/min/1.73 m2 may be an indication of chronic kidney disease (CKD). Calculated eGFR is useful in patients with stable renal function. The eGFR calculation will not be reliable in acutely ill patients when serum creatinine is changing rapidly. It is not useful in  patients on dialysis. The eGFR calculation may not be applicable to patients at the low and high extremes of body sizes, pregnant women, and vegetarians.)  Magnesium, Serum 1.9 (1.8-2.4 THERAPEUTIC RANGE: 4-7 mg/dL TOXIC: > 10 mg/dL  -----------------------)  20-Mar-15 16:00   Cholesterol, Serum 110  Triglycerides, Serum 146  HDL (INHOUSE) 47  VLDL Cholesterol Calculated 29  LDL Cholesterol Calculated 34 (Result(s) reported on 24 Jan 2014 at 04:27PM.)  Cardiac:  15-Mar-15 10:10   Troponin I < 0.02 (0.00-0.05 0.05 ng/mL or less: NEGATIVE  Repeat testing in 3-6 hrs  if clinically indicated. >0.05 ng/mL: POTENTIAL  MYOCARDIAL INJURY. Repeat  testing in 3-6 hrs if  clinically indicated. NOTE: An increase or decrease  of 30% or more on serial  testing suggests a  clinically important change)  Routine UA:  15-Mar-15 10:26   Color (UA) Yellow  Clarity (UA) Hazy  Glucose (UA) 150 mg/dL  Bilirubin  (UA) Negative  Ketones (UA) Negative  Specific Gravity (UA) 1.016  Blood (UA) Negative  pH (UA) 7.0  Protein (UA) 100 mg/dL  Nitrite (UA) Negative  Leukocyte Esterase (UA) 2+ (Result(s) reported on 19 Jan 2014 at 11:14AM.)  RBC (UA) 2 /HPF  WBC (UA) 40 /HPF  Bacteria (UA) 1+  Epithelial Cells (UA) NONE SEEN  WBC Clump (UA) PRESENT  Mucous (UA)  PRESENT (Result(s) reported on 19 Jan 2014 at 11:14AM.)  Routine Coag:  16-Mar-15 14:18   Activated PTT (APTT) 28.8 (A HCT value >55% may artifactually increase the APTT. In one study, the increase was an average of 19%. Reference: "Effect on Routine and Special Coagulation Testing Values of Citrate Anticoagulant Adjustment in Patients with High HCT Values." American Journal of Clinical Pathology 2006;126:400-405.)  Routine Hem:  19-Mar-15 09:59   WBC (CBC) 4.5  RBC (CBC)  3.44  Hemoglobin (CBC)  8.4  Hematocrit (CBC)  26.7  Platelet Count (CBC)  144  MCV  78  MCH  24.5  MCHC  31.5  RDW  20.5  Neutrophil % 86.9  Lymphocyte % 3.7  Monocyte % 7.7  Eosinophil % 0.6  Basophil % 1.1  Neutrophil # 3.9  Lymphocyte #  0.2  Monocyte # 0.3  Eosinophil # 0.0  Basophil # 0.0 (Result(s) reported on 23 Jan 2014 at 10:16AM.)   Radiology Results: [place]:    20-Mar-15 14:51, US Carotid Doppler Bilateral  US Carotid Doppler Bilateral   REASON FOR EXAM:    cva, left facial numbness  COMMENTS:       PROCEDURE: [place]  - US CAROTID DOPPLER BILATERAL  - Jan 24 2014  2:51PM     CLINICAL DATA:  Left facial numbness    EXAM:  BILATERAL CAROTID DUPLEX ULTRASOUND    TECHNIQUE:  Pearline Cables scale imaging, color Doppler and duplex ultrasound were  performed of bilateral carotid and vertebral arteries in the neck.    COMPARISON:  None.  FINDINGS:  Criteria: Quantification of carotid stenosis is based on velocity  parameters that correlate the residual internal carotid diameter  with NASCET-based stenosis levels, using the diameter of the distal  internal  carotid lumen as the denominator for stenosis measurement.    The following velocity measurements were obtained:    RIGHT    ICA:  93 cm/sec    CCA:  223 cm/sec    SYSTOLIC ICA/CCA RATIO:  3.61  DIASTOLIC ICA/CCA RATIO:    ECA:  115 cm/sec    LEFT    ICA:  65 cm/sec    CCA:  73 cm/sec    SYSTOLIC ICA/CCA RATIO:  2.24    DIASTOLIC ICA/CCA RATIO:    ECA:  75 cm/sec  RIGHT CAROTID ARTERY: There is calcified plaque in the upper common  carotid. There is moderate plaque in the bulb. Low resistance  internal carotid Doppler pattern.    RIGHT VERTEBRAL ARTERY:  Antegrade.  Normal Doppler pattern.    LEFT CAROTID ARTERY: Minimal plaque in the bulb. Low resistance  internal carotid Doppler pattern.    Additional findings: Heart rate is irregular suggesting atrial  fibrillation.    LEFT VERTEBRAL ARTERY:  Antegrade.  Normal Doppler pattern.     IMPRESSION:  Less than 50% stenosis in the right and left internal carotid  arteries.    Irregular rhythm.      Electronically Signed    By: Maryclare Bean M.D.    On: 01/24/2014 15:19         Verified By: Jamas Lav, M.D.,  CT:    15-Mar-15 11:12, CT Head Without Contrast  CT Head Without Contrast   REASON FOR EXAM:    confusion, hx of stage 4 prostate ca  COMMENTS:       PROCEDURE: CT  - CT HEAD WITHOUT CONTRAST  - Jan 19 2014 11:12AM     CLINICAL DATA:  Weakness, metastatic prostate cancer  to bone    EXAM:  CT HEAD WITHOUT CONTRAST    TECHNIQUE:  Contiguous axial images were obtained from the base of the skull  through the vertex without intravenous contrast.    COMPARISON:  CT HEAD W/O CM dated 09/26/2013; CT HEAD W/O CM dated  09/25/2013    FINDINGS:  Left parietal encephalomalacia reidentified in area of previous  infarct. Bilateral basal ganglia lacunar infarcts are re-  demonstrated. Mild cortical volume loss noted with proportional  ventricular prominence. Areas of periventricular white  matter  hypodensity are most compatible with small vessel ischemic change.  No acute hemorrhage, infarct, or mass lesion is identified. Orbits  are unremarkable. Images were repeated at the skullbase due to  motion. Possible left maxillary sinus mucous retention cyst or polyp  partly visualized on one image only. No lytic or sclerotic osseous  lesion. Minimal ethmoid mucoperiosteal thickening.     IMPRESSION:  No acute intracranial finding.  Remote findings as discussed above.    Minimal sinusitis.      Electronically Signed    By: Conchita Paris M.D.    On: 01/19/2014 11:23         Verified By: Arline Asp, M.D.,    20-Mar-15 09:30, CT Head Without Contrast  CT Head Without Contrast   REASON FOR EXAM:    left facial numbness  COMMENTS:       PROCEDURE: CT  - CT HEAD WITHOUT CONTRAST  - Jan 24 2014  9:30AM     CLINICAL DATA:  Prostate cancer metastatic. Marland Kitchen Left facial numbness  and weakness    EXAM:  CT HEAD WITHOUT CONTRAST    TECHNIQUE:  Contiguous axial images were obtained from the base of the skull  through the vertex without intravenous contrast.  COMPARISON:  CT head 01/19/2014    FINDINGS:  Acute infarct right cerebellum with progressive low-density edema.  This involvesthe inferior medial cerebellum in the PICA  distribution. Mild mass effect on the fourth ventricle. No  associated hemorrhage. No other acute infarct.    Generalized atrophy. Chronic ischemia throughout the cerebral white  matter. Chronic infarct left posterior frontal cortex. Chronic  superior cerebellar infarct on the right.     IMPRESSION:  Acute infarct right PICA distribution in the cerebellum. Mild  mass-effect. No associated hemorrhage.  Atrophy and chronic ischemic changes.      Electronically Signed    By: Franchot Gallo M.D.    On: 01/24/2014 09:38         Verified By: Truett Perna, M.D.,   Radiology Impression: Radiology Impression: CT of head personally  reviewed by me and shows multiple areas of infarction along the R hemisphere and a R PICA infarct   Impression/Recommendations: Recommendations:   labs reviewed by me notes reviewed by me   R hemispheric infarct-  there are multiple acute appearing areas on CT not mentioned by radiology report that would cause current symptoms.  R PICA infarct-  asymptomatic now, highly doubt metastatic prostate cancer but this is also a possibility needs echocardiogram, if neg, then Loop recorder for 3 months add  ASA 67m daily x 3 months then stop continue  773mdaily continue statin and adjust for LDL < 70  needs PT/OT/ST consults will sign off, [facility]  Electronic Signatures: SmJamison NeighborMD)  (Signed 21-Mar-15 00:20)  Authored: REFERRING PHYSICIAN, Primary Care Physician, Consult, History of Present Illness, Review of Systems, PAST MEDICAL/SURGICAL  HISTORY, HOME MEDICATIONS, ALLERGIES, Social/Family History, NURSING VITAL SIGNS, Physical Exam-, LAB RESULTS, RADIOLOGY RESULTS, Recommendations   Last Updated: 21-Mar-15 00:20 by Jamison Neighbor (MD)

## 2015-02-28 NOTE — Consult Note (Signed)
PATIENT NAME:  Gerald [name], Gerald [name] MR#:  [id number] DATE OF BIRTH:  [birth year]  DATE OF CONSULTATION:  01/20/2014  REFERRING PHYSICIAN:  Demetrios Loll, MD CONSULTING PHYSICIAN:  Isaias Cowman, MD  PRIMARY CARE PHYSICIAN: Meindert A. Brunetta Genera, MD  CHIEF COMPLAINT: Failure to thrive.   REASON FOR CONSULTATION: Consultation requested for evaluation of atrial fibrillation.   HISTORY OF PRESENT ILLNESS: The patient is a 69 year old gentleman with known history of coronary artery disease, status post bypass graft surgery as well as coronary stent. The patient has a history of prostate cancer with bone metastasis, undergoing recent radiation therapy. The patient also has had a history of colovesical fistula. The patient was admitted with chief complaint of lethargy, nausea, vomiting with diagnosis of urinary tract infection and was noted to be in atrial fibrillation with a rapid ventricular rate. The patient denies chest pain, shortness of breath or palpitations. The patient has a CHADS2 score of 3.   PAST MEDICAL HISTORY: 1.  Status post coronary artery bypass graft surgery.  2.  Status post coronary stent approximately 10 to 15 years ago.  3.  Diabetes.  4.  Prostate cancer with bone metastasis.  5.  Chronic kidney disease.  6.  History of TIA. 7.  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  8.  History of colovesical fistula.  MEDICATIONS: Clopidogrel 75 mg daily, metoprolol tartrate 50 mg daily, pravastatin 40 mg daily, Percocet 1 tab q.4 hours p.r.n., fish oil cap 1 gram daily, Amaryl 2 mg daily, vitamin B-complex 1 daily, OxyContin 40 mg q.12, trazodone 150 mg at bedtime, Procrit injections.   SOCIAL HISTORY: The patient is married, lives with his wife. He denies tobacco abuse.   FAMILY HISTORY: No immediate family history for coronary artery disease or myocardial infarction.   REVIEW OF SYSTEMS:    CONSTITUTIONAL: The patient is experiencing generalized fatigue and weakness.  EYES: The  patient notes left-sided blurry vision.  EARS: No hearing loss.  RESPIRATORY: No shortness of breath.  CARDIOVASCULAR: No chest pain.  GASTROINTESTINAL: The patient has had nausea and vomiting.  GENITOURINARY: The patient has a suprapubic catheter with a history of prostate cancer, currently undergoing radiation therapy.  HEMATOLOGICAL: No easy bruising or bleeding.  INTEGUMENTARY: No rash.  NEUROLOGICAL: The patient has experienced left-sided facial numbness.  MUSCULOSKELETAL: No arthralgias or myalgias.  PSYCHOLOGICAL: No depression or anxiety.   PHYSICAL EXAMINATION: VITAL SIGNS: Blood pressure 126/73, pulse 131, respirations 18, temperature 97.1, pulse oximetry 96%.  HEENT: Pupils equal and reactive to light and accommodation.  NECK: Supple without thyromegaly.  LUNGS: Clear.  CARDIOVASCULAR: Normal JVP. Normal PMI. Tachycardia. Normal S1, S2. No appreciable gallop, murmur, or rub.  ABDOMEN: Soft, nontender.  EXTREMITIES: No cyanosis, clubbing, or edema. Pulses were intact bilaterally.  MUSCULOSKELETAL: Normal muscle tone.  NEUROLOGICAL: The patient was alert and oriented, answered questions appropriately. Had some mild left-sided facial drooping.   IMPRESSION: A 69 year old gentleman with known coronary artery disease, status post coronary stent over 10 years ago. The patient presents with lethargy, nausea and vomiting with urinary tract infection related to prostate cancer and recent radiation therapy. The patient is noted to be in atrial fibrillation with a rapid ventricular rate, though patient is asymptomatic. The patient has a CHADS2 score of 3, currently on clopidogrel.   RECOMMENDATIONS: 1.  Up-titrate diltiazem to 60 mg q.6 for better rate control.  2.  Would proceed with chronic anticoagulation with a CHADS2 score of 3. The patient does have baseline chronic kidney disease  with a GFR of 40. In light of patient's comorbidities, will recommend Eliquis 5 mg b.i.d.  3.   Discontinue clopidogrel. 4.  Discontinue heparin drip.  5.  Further recommendations with regard to atrial fibrillation rate pending patient's clinical response to increased dose of diltiazem.  ____________________________ Isaias Cowman, MD ap:jcm D: 01/20/2014 13:47:18 ET T: 01/20/2014 17:03:47 ET JOB#: [id number]  cc: Isaias Cowman, MD, <Dictator> Isaias Cowman MD ELECTRONICALLY SIGNED 01/28/2014 12:45

## 2015-02-28 NOTE — Consult Note (Signed)
History of Present Illness:  Reason for Consult Chief Complaint/Diagnosis:  1. Carcinoma of prostate, diagnosed approximately 6 years ago.  Was treated with implants as well as external beam radiation therapy castration  resistant prostate cancer. 2. status post Provenge April 2012, radiation therapy to the bone finished in August of 2012 3. Progressive disease on-zytiga 4. Started oncabizitaxel from January 30: 2014 5. Colo-vesicle fistula 6. Chronic renal failure and anemia 7. Rising PSA.  Chemotherapy was changed to docetaxel(July, 2014) 8. Progressing disease by PSA criteria the bone scan in December of 2014 9. XOFIGO   HPI   patient was admitted in the hospital with altered mental status ,  anaemia  possible urinary tract infection. 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  Afebrile.  receive procrit   PFSH:  Comments No family history of colorectal cancer, breast cancer, or ovarian cancer.   Comments Does not smoke.  Does not drink   Additional Past Medical and Surgical History History of diabetes, hypertension TIA  Coronary artery disease  Hypercholesterolemia   Review of Systems:  General weakness  fatigue   Performance Status (ECOG) 0  1   HEENT no complaints   Lungs cough  SOB   Cardiac no complaints   GI nausea   GU no complaints   Musculoskeletal back pain  muscle ache  joint pain   Extremities swelling   Skin no complaints   Psych anxiety   Review of Systems   altered mental status improving  Physical Exam:  General altered   mental status has improved.  Patient is alert and oriented.  Feeling hungry.   HEENT: normal   Lungs: clear   Cardiac: irregular rate, rhythm   Abdomen: soft   Skin: intact   Extremities: edema   Neuro: cranial nerves intact     Chronic kidney  disease:    TIA: 2009   Myocardial Infarct: 1999   Cancer prostate and spine:    Ulcer, rectum:    Hydronephrosis:    Anemia:    Diabetes Mellitus, Type II (NIDD):    Hypertension:    Prostrate Cancer:    Multiple cystoscopies and stent exchanges:    CABG (Coronary Artery Bypass Graft): 1996   Ureter stents:    prostate seed implants:    cystoscopy:    cardiac stents:    No Known Allergies:     metoprolol tartrate 50 mg oral tablet: 1 tab(s) orally once a day (in the morning), Status: Active, Quantity: 0, Refills: None   Vitamin D3 400 intl units oral capsule: 1 cap(s) orally once a day (at bedtime), Status: Active, Quantity: 0, Refills: None   glimepiride 2 mg oral tablet: 1 tab(s) orally once a day (in the morning), Status: Active, Quantity: 0, Refills: None   pravastatin 40 mg oral tablet: 1 tab(s) orally once a day (at bedtime), Status: Active, Quantity: 0, Refills: None   trazodone 150 mg oral tablet: 1 tab(s) orally once a day (at bedtime), Status: Active, Quantity: 0, Refills: None   clopidogrel 75 mg oral tablet: 1 tab(s) orally once a day (at bedtime), Status: Active, Quantity: 0, Refills: None   Procrit: 1 dose(s) injectable once a week on Thursday, As Needed for low hemoglobin at the cancer center, Status: Active, Quantity: 0, Refills: None   Fish Oil 1000 mg oral capsule: 1 cap(s) orally once a  day, Status: Active, Quantity: 0, Refills: None   Vitamin C 500 mg oral tablet: 1 tab(s) orally once a day, Status: Active, Quantity: 0, Refills: None   OxyCONTIN 40 mg oral tablet, extended release: 1 tab(s) orally every 12 hours, Status: Active, Quantity: 60, Refills: None   Integra F Vitamin B Complex with C, Folic Acid and Iron oral capsule: 1 cap(s) orally once a day, Status: Active, Quantity: 0, Refills: None   acetaminophen-HYDROcodone: 1 tab(s) (5/500 mg) orally every 4 hours, As Needed - for Pain, Status: Active, Quantity: 0, Refills: None    Vitamin B-12 1000 mcg oral tablet: 1 tab(s) orally once a day, Status: Active, Quantity: 0, Refills: None   multivitamin: 1 tab(s) orally once a day, Status: Active, Quantity: 0, Refills: None  Laboratory Results: LabObservation:  16-Mar-15 13:51   OBSERVATION Reason for Test  Routine Chem:  16-Mar-15 04:45   Glucose, Serum  121  BUN  20  Creatinine (comp)  1.48  Sodium, Serum 138  Potassium, Serum 4.4  Chloride, Serum 107  CO2, Serum 25  Calcium (Total), Serum 8.7  Anion Gap  6  Osmolality (calc) 280  eGFR (African American)  56  eGFR (Non-African American)  48 (eGFR values <52m/min/1.73 m2 may be an indication of chronic kidney disease (CKD). Calculated eGFR is useful in patients with stable renal function. The eGFR calculation will not be reliable in acutely ill patients when serum creatinine is changing rapidly. It is not useful in  patients on dialysis. The eGFR calculation may not be applicable to patients at the low and high extremes of body sizes, pregnant women, and vegetarians.)  Routine Coag:  16-Mar-15 14:18   Activated PTT (APTT) 28.8 (A HCT value >55% may artifactually increase the APTT. In one study, the increase was an average of 19%. Reference: "Effect on Routine and Special Coagulation Testing Values of Citrate Anticoagulant Adjustment in Patients with High HCT Values." American Journal of Clinical Pathology 2006;126:400-405.)  Routine Hem:  16-Mar-15 04:45   WBC (CBC) 4.4  RBC (CBC)  3.41  Hemoglobin (CBC)  8.4  Hematocrit (CBC)  26.5  Platelet Count (CBC) 156  MCV  78  MCH  24.6  MCHC  31.7  RDW  20.5  Neutrophil % 88.0  Lymphocyte % 4.9  Monocyte % 6.7  Eosinophil % 0.3  Basophil % 0.1  Neutrophil # 3.9  Lymphocyte #  0.2  Monocyte # 0.3  Eosinophil # 0.0  Basophil # 0.0 (Result(s) reported on 20 Jan 2014 at 05:25AM.)   Radiology Results: LabUnknown:    15-Mar-15 11:12, CT Head Without Contrast  PACS Image   CT:  CT Head Without  Contrast   REASON FOR EXAM:    confusion, hx of stage 4 prostate ca  COMMENTS:       PROCEDURE: CT  - CT HEAD WITHOUT CONTRAST  - Jan 19 2014 11:12AM     CLINICAL DATA:  Weakness, metastatic prostate cancer to bone    EXAM:  CT HEAD WITHOUT CONTRAST    TECHNIQUE:  Contiguous axial images were obtained from the base of the skull  through the vertex without intravenous contrast.    COMPARISON:  CT HEAD W/O CM dated 09/26/2013; CT HEAD W/O CM dated  09/25/2013    FINDINGS:  Left parietal encephalomalacia reidentified in area of previous  infarct. Bilateral basal ganglia lacunar infarcts are re-  demonstrated. Mild cortical volume loss noted with proportional  ventricular prominence. Areas of periventricular white matter  hypodensity are  most compatible with small vessel ischemic change.  No acute hemorrhage, infarct, or mass lesion is identified. Orbits  are unremarkable. Images were repeated at the skullbase due to  motion. Possible left maxillary sinus mucous retention cyst or polyp  partly visualized on one image only. No lytic or sclerotic osseous  lesion. Minimal ethmoid mucoperiosteal thickening.     IMPRESSION:  No acute intracranial finding.  Remote findings as discussed above.    Minimal sinusitis.      Electronically Signed    By: Conchita Paris M.D.    On: 01/19/2014 11:23         Verified By: Arline Asp, M.D.,   Assessment and Plan: Impression:   1.altered  mental statuslikely secondary to metabolic encephalopathy.    urinary tract  infection, anemia, dehydration contributingprostate with widespread metastases to the bone Anemia secondary to renal failure  and carcinoma prostate    and bone  metastasesiron studies.  Iron supplement may help.  Continue Procritnausea vomiting under control we will switch patient to regular dietpatient can get out of bed and ambulate he can be discharged after identifying organisms in the urine and getting appropriate  antibiotics  Electronic Signatures: Kamy Poinsett, Martie Lee (MD)  (Signed 16-Mar-15 16:56)  Authored: HISTORY OF PRESENT ILLNESS, PFSH, ROS, PE, PAST MEDICAL HISTORY, ALLERGIES, HOME MEDICATIONS, LABS, OTHER RESULTS, ASSESSMENT AND PLAN   Last Updated: 16-Mar-15 16:56 by Jobe Gibbon (MD)

## 2016-03-03 ENCOUNTER — Encounter: Payer: Self-pay | Admitting: Gastroenterology
